# Patient Record
Sex: Female | Born: 1985 | Race: White | Hispanic: No | Marital: Married | Smoking: Former smoker
Health system: Southern US, Community
[De-identification: ages and names within clinical notes are randomized; demographics above are authoritative.]

## PROBLEM LIST (undated history)

## (undated) ENCOUNTER — Inpatient Hospital Stay (HOSPITAL_COMMUNITY): Payer: Self-pay

## (undated) DIAGNOSIS — N809 Endometriosis, unspecified: Secondary | ICD-10-CM

## (undated) DIAGNOSIS — E039 Hypothyroidism, unspecified: Secondary | ICD-10-CM

## (undated) DIAGNOSIS — O24419 Gestational diabetes mellitus in pregnancy, unspecified control: Secondary | ICD-10-CM

## (undated) DIAGNOSIS — B977 Papillomavirus as the cause of diseases classified elsewhere: Secondary | ICD-10-CM

## (undated) HISTORY — DX: Papillomavirus as the cause of diseases classified elsewhere: B97.7

## (undated) HISTORY — DX: Endometriosis, unspecified: N80.9

---
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
## 2004-12-25 DIAGNOSIS — B977 Papillomavirus as the cause of diseases classified elsewhere: Secondary | ICD-10-CM

## 2004-12-25 HISTORY — DX: Papillomavirus as the cause of diseases classified elsewhere: B97.7

## 2005-02-15 ENCOUNTER — Other Ambulatory Visit: Admission: RE | Admit: 2005-02-15 | Discharge: 2005-02-15 | Payer: Self-pay | Admitting: Gynecology

## 2005-08-28 ENCOUNTER — Other Ambulatory Visit: Admission: RE | Admit: 2005-08-28 | Discharge: 2005-08-28 | Payer: Self-pay | Admitting: Gynecology

## 2010-09-17 ENCOUNTER — Encounter: Payer: Self-pay | Admitting: Family Medicine

## 2011-03-16 ENCOUNTER — Other Ambulatory Visit (HOSPITAL_COMMUNITY)
Admission: RE | Admit: 2011-03-16 | Discharge: 2011-03-16 | Disposition: A | Discharge status: Home / Self Care | Payer: Managed Care, Other (non HMO) | Source: Ambulatory Visit | Attending: Gynecology | Admitting: Gynecology

## 2011-03-16 ENCOUNTER — Ambulatory Visit (INDEPENDENT_AMBULATORY_CARE_PROVIDER_SITE_OTHER): Payer: Managed Care, Other (non HMO) | Admitting: Women's Health

## 2011-03-16 ENCOUNTER — Other Ambulatory Visit: Payer: Self-pay | Admitting: Women's Health

## 2011-03-16 DIAGNOSIS — R823 Hemoglobinuria: Secondary | ICD-10-CM

## 2011-03-16 DIAGNOSIS — Z124 Encounter for screening for malignant neoplasm of cervix: Secondary | ICD-10-CM | POA: Insufficient documentation

## 2011-03-16 DIAGNOSIS — B373 Candidiasis of vulva and vagina: Secondary | ICD-10-CM

## 2011-03-16 DIAGNOSIS — Z833 Family history of diabetes mellitus: Secondary | ICD-10-CM

## 2011-03-16 DIAGNOSIS — Z01419 Encounter for gynecological examination (general) (routine) without abnormal findings: Secondary | ICD-10-CM

## 2011-03-20 ENCOUNTER — Telehealth: Payer: Self-pay | Admitting: Women's Health

## 2011-03-20 NOTE — Telephone Encounter (Signed)
Telephone call to patient and informed pad had no endocervical cells. Transferred call to appointments to schedule Pap. Patient has history of HPV. Last Pap had been normal.

## 2011-03-28 ENCOUNTER — Ambulatory Visit (INDEPENDENT_AMBULATORY_CARE_PROVIDER_SITE_OTHER): Payer: Managed Care, Other (non HMO) | Admitting: Women's Health

## 2011-03-28 ENCOUNTER — Other Ambulatory Visit (HOSPITAL_COMMUNITY)
Admission: RE | Admit: 2011-03-28 | Discharge: 2011-03-28 | Disposition: A | Discharge status: Home / Self Care | Payer: Managed Care, Other (non HMO) | Source: Ambulatory Visit | Attending: Women's Health | Admitting: Women's Health

## 2011-03-28 ENCOUNTER — Encounter: Payer: Self-pay | Admitting: Women's Health

## 2011-03-28 DIAGNOSIS — Z01419 Encounter for gynecological examination (general) (routine) without abnormal findings: Secondary | ICD-10-CM | POA: Insufficient documentation

## 2011-03-28 DIAGNOSIS — B3731 Acute candidiasis of vulva and vagina: Secondary | ICD-10-CM

## 2011-03-28 DIAGNOSIS — N898 Other specified noninflammatory disorders of vagina: Secondary | ICD-10-CM

## 2011-03-28 DIAGNOSIS — Z5189 Encounter for other specified aftercare: Secondary | ICD-10-CM

## 2011-03-28 DIAGNOSIS — R823 Hemoglobinuria: Secondary | ICD-10-CM

## 2011-03-28 DIAGNOSIS — R87616 Satisfactory cervical smear but lacking transformation zone: Secondary | ICD-10-CM

## 2011-03-28 DIAGNOSIS — B373 Candidiasis of vulva and vagina: Secondary | ICD-10-CM

## 2011-03-28 MED ORDER — NYSTATIN 100000 UNIT/GM EX CREA: TOPICAL_CREAM | Freq: Two times a day (BID) | CUTANEOUS | Status: DC

## 2011-03-28 NOTE — Progress Notes (Signed)
Addended byCammie Mcgee T on: 03/28/2011 02:27 PM   Modules accepted: Orders

## 2011-03-28 NOTE — Progress Notes (Signed)
Addended byCammie Mcgee T on: 03/28/2011 02:45 PM   Modules accepted: Orders

## 2011-03-28 NOTE — Progress Notes (Signed)
  Presents for a repeat Pap. Pap at annual exam on 7/20 was negative but lack endocervical cells. History of HPV in 2006, normal Paps after. At annual exam had a urine with 25,000 pure, symptomatic only of urgency, will check a TOC today. She was treated with 3 days of Septra . Was treated for yeast at her annual exam, states is much better after taking one Diflucan but states has occasional vaginal irritation. Nystatin cream to be used when necessary for external irritation. External genitalia is within normal limits, speculum exam cervix is pink healthy without lesion or discharge, Pap was taken and will triage based on those results.

## 2011-03-30 ENCOUNTER — Telehealth: Payer: Self-pay | Admitting: Women's Health

## 2011-04-02 NOTE — Telephone Encounter (Signed)
Left message on patient cell per her request that Pap was negative in UA was negative also.

## 2012-09-26 ENCOUNTER — Encounter: Payer: Self-pay | Admitting: Women's Health

## 2012-09-26 ENCOUNTER — Other Ambulatory Visit: Payer: Self-pay | Admitting: Women's Health

## 2012-09-26 ENCOUNTER — Ambulatory Visit (INDEPENDENT_AMBULATORY_CARE_PROVIDER_SITE_OTHER): Payer: BC Managed Care – PPO | Admitting: Women's Health

## 2012-09-26 VITALS — BP 116/64 | Ht 59.5 in | Wt 153.0 lb

## 2012-09-26 DIAGNOSIS — Z833 Family history of diabetes mellitus: Secondary | ICD-10-CM

## 2012-09-26 DIAGNOSIS — Z01419 Encounter for gynecological examination (general) (routine) without abnormal findings: Secondary | ICD-10-CM

## 2012-09-26 DIAGNOSIS — E079 Disorder of thyroid, unspecified: Secondary | ICD-10-CM

## 2012-09-26 LAB — CBC WITH DIFFERENTIAL/PLATELET
Basophils Absolute: 0 10*3/uL (ref 0.0–0.1)
Basophils Relative: 0 % (ref 0–1)
Eosinophils Absolute: 0.2 10*3/uL (ref 0.0–0.7)
Eosinophils Relative: 3 % (ref 0–5)
HCT: 37.7 % (ref 36.0–46.0)
Hemoglobin: 12.9 g/dL (ref 12.0–15.0)
Lymphocytes Relative: 42 % (ref 12–46)
Lymphs Abs: 2.3 10*3/uL (ref 0.7–4.0)
MCH: 29.9 pg (ref 26.0–34.0)
MCHC: 34.2 g/dL (ref 30.0–36.0)
MCV: 87.3 fL (ref 78.0–100.0)
Monocytes Absolute: 0.3 10*3/uL (ref 0.1–1.0)
Monocytes Relative: 6 % (ref 3–12)
Neutro Abs: 2.6 10*3/uL (ref 1.7–7.7)
Neutrophils Relative %: 49 % (ref 43–77)
Platelets: 324 10*3/uL (ref 150–400)
RBC: 4.32 MIL/uL (ref 3.87–5.11)
RDW: 13.2 % (ref 11.5–15.5)
WBC: 5.4 10*3/uL (ref 4.0–10.5)

## 2012-09-26 LAB — GLUCOSE, RANDOM: Glucose, Bld: 91 mg/dL (ref 70–99)

## 2012-09-26 LAB — TSH: TSH: 6.091 u[IU]/mL — ABNORMAL HIGH (ref 0.350–4.500)

## 2012-09-26 NOTE — Progress Notes (Signed)
Deanna Hicks 1986-08-22 [id number]    History:    The patient presents for annual exam.  Regular monthly cycle/condoms. No complaints. Gardasil series completed in 06. History of normal Paps.   Past medical history, past surgical history, family history and social history were all reviewed and documented in the EPIC chart. Father died type 1 diabetes complication. Mother and sister history of endometriosis. Recently graduated from nursing school, starting work next week at [employer] in [employer].   ROS:  A  ROS was performed and pertinent positives and negatives are included in the history.  Exam:  Filed Vitals:   09/26/12 0952  BP: 116/64    General appearance:  Normal Head/Neck:  Normal, without cervical or supraclavicular adenopathy. Thyroid:  Symmetrical, normal in size, without palpable masses or nodularity. Respiratory  Effort:  Normal  Auscultation:  Clear without wheezing or rhonchi Cardiovascular  Auscultation:  Regular rate, without rubs, murmurs or gallops  Edema/varicosities:  Not grossly evident Abdominal  Soft,nontender, without masses, guarding or rebound.  Liver/spleen:  No organomegaly noted  Hernia:  None appreciated  Skin  Inspection:  Grossly normal  Palpation:  Grossly normal Neurologic/psychiatric  Orientation:  Normal with appropriate conversation.  Mood/affect:  Normal  Genitourinary    Breasts: Examined lying and sitting.     Right: Without masses, retractions, discharge or axillary adenopathy.     Left: Without masses, retractions, discharge or axillary adenopathy.   Inguinal/mons:  Normal without inguinal adenopathy  External genitalia:  Normal  BUS/Urethra/Skene's glands:  Normal  Bladder:  Normal  Vagina:  Normal  Cervix:  Normal  Uterus:   normal in size, shape and contour.  Midline and mobile  Adnexa/parametria:     Rt: Without masses or tenderness.   Lt: Without masses or tenderness.  Anus and  perineum: Normal  Digital rectal exam: Normal sphincter tone without palpated masses or tenderness  Assessment/Plan:  27 y.o. M. WF G0 for annual exam with no complaints.  Normal GYN exam/condoms 18 pound weight gain past year and a half  Plan: Reviewed importance of decreasing calories, increasing exercise for weight loss. SBE's, calcium rich diet, MVI daily encouraged. Contraception options reviewed and declined, will continue condoms. Aware Plan B emergency contraception. CBC, glucose, TSH, UA, Pap normal 2012, new screening guidelines reviewed.    Harrington Challenger WHNP, 11:15 AM 09/26/2012

## 2012-09-26 NOTE — Patient Instructions (Addendum)

## 2012-09-27 LAB — URINALYSIS W MICROSCOPIC + REFLEX CULTURE
Casts: NONE SEEN
Crystals: NONE SEEN
Glucose, UA: NEGATIVE mg/dL
Hgb urine dipstick: NEGATIVE
Ketones, ur: NEGATIVE mg/dL
Leukocytes, UA: NEGATIVE
Nitrite: NEGATIVE
Protein, ur: NEGATIVE mg/dL
Specific Gravity, Urine: >1.03 — ABNORMAL HIGH (ref 1.005–1.030)
Urobilinogen, UA: 0.2 mg/dL (ref 0.0–1.0)
pH: 5.5 (ref 5.0–8.0)

## 2012-09-29 LAB — THYROID PROFILE - CHCC
Free Thyroxine Index: 3.1 (ref 1.0–3.9)
T3 Uptake: 32.8 % (ref 22.5–37.0)
T4, Total: 9.5 ug/dL (ref 5.0–12.5)

## 2012-10-02 ENCOUNTER — Other Ambulatory Visit: Payer: Self-pay | Admitting: Women's Health

## 2012-10-02 DIAGNOSIS — E039 Hypothyroidism, unspecified: Secondary | ICD-10-CM

## 2012-10-02 MED ORDER — LEVOTHYROXINE SODIUM 50 MCG PO TABS: 50.0000 ug | ORAL_TABLET | Freq: Every day | ORAL | Status: DC

## 2012-10-11 ENCOUNTER — Other Ambulatory Visit: Payer: Self-pay

## 2012-10-28 ENCOUNTER — Telehealth: Payer: Self-pay | Admitting: *Deleted

## 2012-10-28 NOTE — Telephone Encounter (Signed)
Ok, pt was diagnosed with hypothyroidism in Jan,2014, TSH 6.09 placed on Synthroid 50, had a normal rest of thyroid panel.  Please send labs and OV note  thanks

## 2012-10-28 NOTE — Telephone Encounter (Signed)
Pt called requesting referral to Dr.Balan, pt has family history of hyperthyroidism. She takes Synthroid 50 mcg daily. Please advise

## 2012-10-29 NOTE — Telephone Encounter (Signed)
appt on 11/25/12 @ 9:30 a. Left time and date on pt voicemail.

## 2012-10-29 NOTE — Telephone Encounter (Signed)
Pt informed notes and labs faxed to [facility] office.

## 2012-11-28 ENCOUNTER — Other Ambulatory Visit: Payer: Self-pay | Admitting: Women's Health

## 2013-01-05 ENCOUNTER — Other Ambulatory Visit: Payer: Self-pay | Admitting: Women's Health

## 2013-01-06 NOTE — Telephone Encounter (Signed)
LM for pt she is due for a TSH before refill KW

## 2013-01-08 NOTE — Telephone Encounter (Signed)
Rx sent and left detailed message that pt needs TSH KW

## 2013-07-02 ENCOUNTER — Other Ambulatory Visit: Payer: Self-pay

## 2013-10-06 ENCOUNTER — Encounter: Payer: Self-pay | Admitting: Women's Health

## 2013-10-09 ENCOUNTER — Encounter: Payer: Self-pay | Admitting: Women's Health

## 2013-10-09 ENCOUNTER — Ambulatory Visit (INDEPENDENT_AMBULATORY_CARE_PROVIDER_SITE_OTHER): Payer: BC Managed Care – PPO | Admitting: Women's Health

## 2013-10-09 ENCOUNTER — Other Ambulatory Visit (HOSPITAL_COMMUNITY)
Admission: RE | Admit: 2013-10-09 | Discharge: 2013-10-09 | Disposition: A | Discharge status: Home / Self Care | Payer: BC Managed Care – PPO | Source: Ambulatory Visit | Attending: Gynecology | Admitting: Gynecology

## 2013-10-09 VITALS — BP 122/76 | Ht 59.0 in | Wt 131.0 lb

## 2013-10-09 DIAGNOSIS — E039 Hypothyroidism, unspecified: Secondary | ICD-10-CM | POA: Insufficient documentation

## 2013-10-09 DIAGNOSIS — Z01419 Encounter for gynecological examination (general) (routine) without abnormal findings: Secondary | ICD-10-CM | POA: Insufficient documentation

## 2013-10-09 DIAGNOSIS — E079 Disorder of thyroid, unspecified: Secondary | ICD-10-CM

## 2013-10-09 LAB — CBC WITH DIFFERENTIAL/PLATELET
Basophils Absolute: 0 10*3/uL (ref 0.0–0.1)
Basophils Relative: 0 % (ref 0–1)
Eosinophils Absolute: 0.1 10*3/uL (ref 0.0–0.7)
Eosinophils Relative: 1 % (ref 0–5)
HCT: 38.3 % (ref 36.0–46.0)
Hemoglobin: 13.5 g/dL (ref 12.0–15.0)
Lymphocytes Relative: 27 % (ref 12–46)
Lymphs Abs: 2.1 10*3/uL (ref 0.7–4.0)
MCH: 31.3 pg (ref 26.0–34.0)
MCHC: 35.2 g/dL (ref 30.0–36.0)
MCV: 88.7 fL (ref 78.0–100.0)
Monocytes Absolute: 0.5 10*3/uL (ref 0.1–1.0)
Monocytes Relative: 6 % (ref 3–12)
Neutro Abs: 5.2 10*3/uL (ref 1.7–7.7)
Neutrophils Relative %: 66 % (ref 43–77)
Platelets: 343 10*3/uL (ref 150–400)
RBC: 4.32 MIL/uL (ref 3.87–5.11)
RDW: 13.1 % (ref 11.5–15.5)
WBC: 7.9 10*3/uL (ref 4.0–10.5)

## 2013-10-09 NOTE — Patient Instructions (Signed)
Pregnancy If you are planning on getting pregnant, it is a good idea to make a preconception appointment with your caregiver to discuss having a healthy lifestyle before getting pregnant. This includes diet, weight, exercise, taking prenatal vitamins (especially folic acid, which helps prevent brain and spinal cord defects), avoiding alcohol, smoking and illegal drugs, medical problems (diabetes, convulsions), family history of genetic problems, working conditions, and immunizations. It is better to have knowledge of these things and do something about them before getting pregnant. During your pregnancy, it is important to follow certain guidelines in order to have a healthy baby. It is very important to get good prenatal care and follow your caregiver's instructions. Prenatal care includes all the medical care you receive before your baby's birth. This helps to prevent problems during the pregnancy and childbirth. HOME CARE INSTRUCTIONS   Start your prenatal visits by the 12th week of pregnancy or earlier, if possible. At first, appointments are usually scheduled monthly. They become more frequent in the last 2 months before delivery. It is important that you keep your caregiver's appointments and follow your caregiver's instructions regarding medication use, exercise, and diet.  During pregnancy, you are providing food for you and your baby. Eat a regular, well-balanced diet. Choose foods such as meat, fish, milk and other dairy products, vegetables, fruits, whole-grain breads and cereals. Your caregiver will inform you of the ideal weight gain depending on your current height and weight. Drink lots of liquids. Try to drink 8 glasses of water a day.  Alcohol is associated with a number of birth defects including fetal alcohol syndrome. It is best to avoid alcohol completely. Smoking will cause low birth rate and prematurity. Use of alcohol and nicotine during your pregnancy also increases the chances that  your child will be chemically dependent later in their life and may contribute to SIDS (Sudden Infant Death Syndrome).  Do not use illegal drugs.  Only take prescription or over-the-counter medications that are recommended by your caregiver. Other medications can cause genetic and physical problems in the baby.  Morning sickness can often be helped by keeping soda crackers at the bedside. Eat a few before getting up in the morning.  A sexual relationship may be continued until near the end of pregnancy if there are no other problems such as early (premature) leaking of amniotic fluid from the membranes, vaginal bleeding, painful intercourse or belly (abdominal) pain.  Exercise regularly. Check with your caregiver if you are unsure of the safety of some of your exercises.  Do not use hot tubs, steam rooms or saunas. These increase the risk of fainting and hurting yourself and the baby. Swimming is OK for exercise. Get plenty of rest, including afternoon naps when possible, especially in the third trimester.  Avoid toxic odors and chemicals.  Do not wear high heels. They may cause you to lose your balance and fall.  Do not lift over 5 pounds. If you do lift anything, lift with your legs and thighs, not your back.  Avoid long trips, especially in the third trimester.  If you have to travel out of the city or state, take a copy of your medical records with you. SEEK IMMEDIATE MEDICAL CARE IF:   You develop an unexplained oral temperature above 102 F (38.9 C), or as your caregiver suggests.  You have leaking of fluid from the vagina. If leaking membranes are suspected, take your temperature and inform your caregiver of this when you call.  There is vaginal spotting   or bleeding. Notify your caregiver of the amount and how many pads are used.  You continue to feel sick to your stomach (nauseous) and have no relief from remedies suggested, or you throw up (vomit) blood or coffee ground like  materials.  You develop upper abdominal pain.  You have round ligament discomfort in the lower abdominal area. This still must be evaluated by your caregiver.  You feel contractions of the uterus.  You do not feel the baby move, or there is less movement than before.  You have painful urination.  You have abnormal vaginal discharge.  You have persistent diarrhea.  You get a severe headache.  You have problems with your vision.  You develop muscle weakness.  You feel dizzy and faint.  You develop shortness of breath.  You develop chest pain.  You have back pain that travels down to your leg and feet.  You feel irregular or a very fast heartbeat.  You develop excessive weight gain in a short period of time (5 pounds in 3 to 5 days).  You are involved in a domestic violence situation. Document Released: 08/13/2005 Document Revised: 02/12/2012 Document Reviewed: 02/04/2009 [facility] Patient Information 2014 [place]. Health Maintenance, Female A healthy lifestyle and preventative care can promote health and wellness.  Maintain regular health, dental, and eye exams.  Eat a healthy diet. Foods like vegetables, fruits, whole grains, low-fat dairy products, and lean protein foods contain the nutrients you need without too many calories. Decrease your intake of foods high in solid fats, added sugars, and salt. Get information about a proper diet from your caregiver, if necessary.  Regular physical exercise is one of the most important things you can do for your health. Most adults should get at least 150 minutes of moderate-intensity exercise (any activity that increases your heart rate and causes you to sweat) each week. In addition, most adults need muscle-strengthening exercises on 2 or more days a week.   Maintain a healthy weight. The body mass index (BMI) is a screening tool to identify possible weight problems. It provides an estimate of body fat based on height and  weight. Your caregiver can help determine your BMI, and can help you achieve or maintain a healthy weight. For adults 20 years and older:  A BMI below 18.5 is considered underweight.  A BMI of 18.5 to 24.9 is normal.  A BMI of 25 to 29.9 is considered overweight.  A BMI of 30 and above is considered obese.  Maintain normal blood lipids and cholesterol by exercising and minimizing your intake of saturated fat. Eat a balanced diet with plenty of fruits and vegetables. Blood tests for lipids and cholesterol should begin at age 76 and be repeated every 5 years. If your lipid or cholesterol levels are high, you are over 50, or you are a high risk for heart disease, you may need your cholesterol levels checked more frequently.Ongoing high lipid and cholesterol levels should be treated with medicines if diet and exercise are not effective.  If you smoke, find out from your caregiver how to quit. If you do not use tobacco, do not start.  Lung cancer screening is recommended for adults aged 85 80 years who are at high risk for developing lung cancer because of a history of smoking. Yearly low-dose computed tomography (CT) is recommended for people who have at least a 30-pack-year history of smoking and are a current smoker or have quit within the past 15 years. A pack year  of smoking is smoking an average of 1 pack of cigarettes a day for 1 year (for example: 1 pack a day for 30 years or 2 packs a day for 15 years). Yearly screening should continue until the smoker has stopped smoking for at least 15 years. Yearly screening should also be stopped for people who develop a health problem that would prevent them from having lung cancer treatment.  If you are pregnant, do not drink alcohol. If you are breastfeeding, be very cautious about drinking alcohol. If you are not pregnant and choose to drink alcohol, do not exceed 1 drink per day. One drink is considered to be 12 ounces (355 mL) of beer, 5 ounces (148  mL) of wine, or 1.5 ounces (44 mL) of liquor.  Avoid use of street drugs. Do not share needles with anyone. Ask for help if you need support or instructions about stopping the use of drugs.  High blood pressure causes heart disease and increases the risk of stroke. Blood pressure should be checked at least every 1 to 2 years. Ongoing high blood pressure should be treated with medicines, if weight loss and exercise are not effective.  If you are 91 to 30 years old, ask your caregiver if you should take aspirin to prevent strokes.  Diabetes screening involves taking a blood sample to check your fasting blood sugar level. This should be done once every 3 years, after age 19, if you are within normal weight and without risk factors for diabetes. Testing should be considered at a younger age or be carried out more frequently if you are overweight and have at least 1 risk factor for diabetes.  Breast cancer screening is essential preventative care for women. You should practice "breast self-awareness." This means understanding the normal appearance and feel of your breasts and may include breast self-examination. Any changes detected, no matter how small, should be reported to a caregiver. Women in their 47s and 30s should have a clinical breast exam (CBE) by a caregiver as part of a regular health exam every 1 to 3 years. After age 68, women should have a CBE every year. Starting at age 79, women should consider having a mammogram (breast X-ray) every year. Women who have a family history of breast cancer should talk to their caregiver about genetic screening. Women at a high risk of breast cancer should talk to their caregiver about having an MRI and a mammogram every year.  Breast cancer gene (BRCA)-related cancer risk assessment is recommended for women who have family members with BRCA-related cancers. BRCA-related cancers include breast, ovarian, tubal, and peritoneal cancers. Having family members with  these cancers may be associated with an increased risk for harmful changes (mutations) in the breast cancer genes BRCA1 and BRCA2. Results of the assessment will determine the need for genetic counseling and BRCA1 and BRCA2 testing.  The Pap test is a screening test for cervical cancer. Women should have a Pap test starting at age 63. Between ages 88 and 78, Pap tests should be repeated every 2 years. Beginning at age 10, you should have a Pap test every 3 years as long as the past 3 Pap tests have been normal. If you had a hysterectomy for a problem that was not cancer or a condition that could lead to cancer, then you no longer need Pap tests. If you are between ages 72 and 8, and you have had normal Pap tests going back 10 years, you no longer need Pap  tests. If you have had past treatment for cervical cancer or a condition that could lead to cancer, you need Pap tests and screening for cancer for at least 20 years after your treatment. If Pap tests have been discontinued, risk factors (such as a new sexual partner) need to be reassessed to determine if screening should be resumed. Some women have medical problems that increase the chance of getting cervical cancer. In these cases, your caregiver may recommend more frequent screening and Pap tests.  The human papillomavirus (HPV) test is an additional test that may be used for cervical cancer screening. The HPV test looks for the virus that can cause the cell changes on the cervix. The cells collected during the Pap test can be tested for HPV. The HPV test could be used to screen women aged 86 years and older, and should be used in women of any age who have unclear Pap test results. After the age of 67, women should have HPV testing at the same frequency as a Pap test.  Colorectal cancer can be detected and often prevented. Most routine colorectal cancer screening begins at the age of 42 and continues through age 89. However, your caregiver may recommend  screening at an earlier age if you have risk factors for colon cancer. On a yearly basis, your caregiver may provide home test kits to check for hidden blood in the stool. Use of a small camera at the end of a tube, to directly examine the colon (sigmoidoscopy or colonoscopy), can detect the earliest forms of colorectal cancer. Talk to your caregiver about this at age 46, when routine screening begins. Direct examination of the colon should be repeated every 5 to 10 years through age 72, unless early forms of pre-cancerous polyps or small growths are found.  Hepatitis C blood testing is recommended for all people born from [birth year] through 1967 and any individual with known risks for hepatitis C.  Practice safe sex. Use condoms and avoid high-risk sexual practices to reduce the spread of sexually transmitted infections (STIs). Sexually active women aged 30 and younger should be checked for Chlamydia, which is a common sexually transmitted infection. Older women with new or multiple partners should also be tested for Chlamydia. Testing for other STIs is recommended if you are sexually active and at increased risk.  Osteoporosis is a disease in which the bones lose minerals and strength with aging. This can result in serious bone fractures. The risk of osteoporosis can be identified using a bone density scan. Women ages 90 and over and women at risk for fractures or osteoporosis should discuss screening with their caregivers. Ask your caregiver whether you should be taking a calcium supplement or vitamin D to reduce the rate of osteoporosis.  Menopause can be associated with physical symptoms and risks. Hormone replacement therapy is available to decrease symptoms and risks. You should talk to your caregiver about whether hormone replacement therapy is right for you.  Use sunscreen. Apply sunscreen liberally and repeatedly throughout the day. You should seek shade when your shadow is shorter than you. Protect  yourself by wearing long sleeves, pants, a wide-brimmed hat, and sunglasses year round, whenever you are outdoors.  Notify your caregiver of new moles or changes in moles, especially if there is a change in shape or color. Also notify your caregiver if a mole is larger than the size of a pencil eraser.  Stay current with your immunizations. Document Released: 02/26/2011 Document Revised: 12/08/2012 Document Reviewed: 02/26/2011  09/24/2011 °Exit[employer]® Patient Information ©2014 [employer]. ° °

## 2013-10-09 NOTE — Addendum Note (Signed)
Addended by: Bertram SavinGONZALEZ-Hopie Pellegrin A on: 10/09/2013 03:50 PM   Modules accepted: Orders

## 2013-10-09 NOTE — Progress Notes (Signed)
Deanna Hicks 02-15-1986 [id number]    History:    Presents for annual exam.  Monthly cycle condoms until recently, planning to  conceive. Gardasil series completed. Normal Paps. Hypothyroid without followup. Has lost 20 pounds in the past year with diet.  Past medical history, past surgical history, family history and social history were all reviewed and documented in the EPIC chart[place] [facility] [employer]. Father type 1 diabetes, heart disease died at age 30. Sister  Hashimoto thyroiditis.  ROS:  A  ROS was performed and pertinent positives and negatives are included.  Exam:  Filed Vitals:   10/09/13 1410  BP: 122/76    General appearance:  Normal Thyroid:  Symmetrical, normal in size, without palpable masses or nodularity. Respiratory  Auscultation:  Clear without wheezing or rhonchi Cardiovascular  Auscultation:  Regular rate, without rubs, murmurs or gallops  Edema/varicosities:  Not grossly evident Abdominal  Soft,nontender, without masses, guarding or rebound.  Liver/spleen:  No organomegaly noted  Hernia:  None appreciated  Skin  Inspection:  Grossly normal   Breasts: Examined lying and sitting.     Right: Without masses, retractions, discharge or axillary adenopathy.     Left: Without masses, retractions, discharge or axillary adenopathy. Gentitourinary   Inguinal/mons:  Normal without inguinal adenopathy  External genitalia:  Normal  BUS/Urethra/Skene's glands:  Normal  Vagina:  Normal  Cervix:  Normal  Uterus:  normal in size, shape and contour.  Midline and mobile  Adnexa/parametria:     Rt: Without masses or tenderness.   Lt: Without masses or tenderness.  Anus and perineum: Normal  Digital rectal exam: Normal sphincter tone without palpated masses or tenderness  Assessment/Plan:  28 y.o. MWF G0 for annual exam.     Hypothyroid without followup Normal GYN exam planning conception  Plan: CBC, TSH . thyroid panel, rubella titer, UA, Pap. Pap  normal 2012, new screening guidelines reviewed. SBE's, regular exercise, calcium rich diet, prenatal vitamin daily,  healthy pregnancy behaviors reviewed. Aware we no longer deliver, viability ultrasound with missed cycle.    Harrington ChallengerYOUNG,Deanna Hicks, 3:41 PM 10/09/2013

## 2013-10-10 LAB — URINALYSIS W MICROSCOPIC + REFLEX CULTURE
Bacteria, UA: NONE SEEN
Bilirubin Urine: NEGATIVE
Casts: NONE SEEN
Crystals: NONE SEEN
Glucose, UA: NEGATIVE mg/dL
Hgb urine dipstick: NEGATIVE
Ketones, ur: NEGATIVE mg/dL
Leukocytes, UA: NEGATIVE
Nitrite: NEGATIVE
Protein, ur: NEGATIVE mg/dL
Specific Gravity, Urine: <1.005 — ABNORMAL LOW (ref 1.005–1.030)
Squamous Epithelial / LPF: NONE SEEN
Urobilinogen, UA: 0.2 mg/dL (ref 0.0–1.0)
pH: 7 (ref 5.0–8.0)

## 2013-10-10 LAB — RUBELLA SCREEN: Rubella: 4.05 Index — ABNORMAL HIGH (ref <0.90)

## 2013-10-10 LAB — T3 UPTAKE: T3 Uptake: 33.2 % (ref 22.5–37.0)

## 2013-10-10 LAB — TSH: TSH: 4.6 u[IU]/mL — ABNORMAL HIGH (ref 0.350–4.500)

## 2013-10-10 LAB — T4: T4, Total: 9.6 ug/dL (ref 5.0–12.5)

## 2013-10-12 LAB — THYROID ANTIBODIES
Thyroglobulin Ab: <20 U/mL (ref <40.0)
Thyroperoxidase Ab SerPl-aCnc: 147 IU/mL — ABNORMAL HIGH (ref <35.0)

## 2013-10-13 ENCOUNTER — Encounter: Payer: Self-pay | Admitting: Women's Health

## 2013-10-14 ENCOUNTER — Encounter: Payer: Self-pay | Admitting: Women's Health

## 2013-10-15 ENCOUNTER — Other Ambulatory Visit: Payer: Self-pay | Admitting: Women's Health

## 2013-10-15 ENCOUNTER — Telehealth: Payer: Self-pay | Admitting: *Deleted

## 2013-10-15 ENCOUNTER — Telehealth: Payer: Self-pay

## 2013-10-15 MED ORDER — LEVOTHYROXINE SODIUM 50 MCG PO TABS: ORAL_TABLET | ORAL | Status: DC

## 2013-10-15 MED ORDER — LEVOTHYROXINE SODIUM 75 MCG PO TABS: ORAL_TABLET | ORAL | Status: DC

## 2013-10-15 NOTE — Telephone Encounter (Signed)
[facility] will contact me with time and date.

## 2013-10-15 NOTE — Telephone Encounter (Signed)
Message copied by Aura CampsWEBB, JENNIFER L on Thu Oct 15, 2013 10:40 AM ------      Message from: Keenan BachelorANNAS, KATHERINE R      Created: Thu Oct 15, 2013  9:07 AM      Regarding: Needs referral to [facility]       Email said "Wenda LowHi Nancy,           Dr. Willeen CassBalan's office needs another referral before they will allow me to make an appointment ."            Evidently she has seen Dr. Talmage NapBalan in the past and Harriett Sineancy told her to go back and see her due to TSH result. Harriett Sineancy wrote her:      " TSH only slightly elevated, and thyroid panel normal but positive for antibodies,  Take lab results to Dr Talmage NapBalan.  Stay well   Harriett SineNancy  "                            ------

## 2013-10-15 NOTE — Telephone Encounter (Signed)
Patient emailed you back. "Hi Harriett SineNancy,  Dr. Willeen CassBalan's office needs another referral before they will allow me to make an appointment. Also, is it possible to have my prescription for levothyroxine refilled while I am waiting to see Dr. Talmage NapBalan."  Victorino DikeJennifer will take care of the referral to Dr. Talmage NapBalan. Ok to refill her levothyroxine?

## 2013-10-15 NOTE — Telephone Encounter (Signed)
Thanks for getting referral done, TSH slightly high, has been on 50, increase to 75 daily Monday thru_Friday and 50 on the weekends.  She is a Engineer, civil (consulting)nurse.  thanks

## 2013-10-15 NOTE — Telephone Encounter (Signed)
Patient informed via email. Rxs sent to her pharmacy with directions for days of week to take.

## 2013-10-28 NOTE — Telephone Encounter (Signed)
[facility] called back from Dr.Balan and patient scheduled on 11/17/13 @ 9:30 am

## 2014-03-04 ENCOUNTER — Other Ambulatory Visit: Payer: Self-pay

## 2014-04-27 ENCOUNTER — Encounter: Payer: Self-pay | Admitting: Women's Health

## 2014-04-27 ENCOUNTER — Ambulatory Visit (INDEPENDENT_AMBULATORY_CARE_PROVIDER_SITE_OTHER): Payer: BC Managed Care – PPO | Admitting: Women's Health

## 2014-04-27 DIAGNOSIS — N912 Amenorrhea, unspecified: Secondary | ICD-10-CM

## 2014-04-27 LAB — PREGNANCY, URINE: Preg Test, Ur: POSITIVE

## 2014-04-27 NOTE — Progress Notes (Signed)
Patient ID: DELCIE RUPPERT, female   DOB: November 07, 1985, 28 y.o.   MRN: [id number] Presents with positive urine pregnancy test. LMP 03/21/2014 normal cycle. Taking prenatal vitamin daily, denies abdominal pain, bleeding or discharge. Aware of safe pregnancy behaviors. Rubella immune, hypothyroid. Has followup with Dr. Talmage Nap today.  Exam: Appears well. U PT positive  Early pregnancy approximately 5 weeks Hypothyroid  Plan: Continue prenatal vitamin daily, safe pregnancy behaviors and keep scheduled followup for hypothyroidism. Schedule  dating/viability ultrasound after September 15. Aware we no longer deliver, will schedule new OB care after ultrasound.

## 2014-04-27 NOTE — Patient Instructions (Signed)
Exercise During Pregnancy It is possible to maintain a healthy exercise program throughout a pregnancy. However, it is important to discuss exercising with your caregiver, so that the two of you may develop an appropriate exercise program. It is important to remember that exercise during pregnancy should be use to maintain one's health and not to lose weight. Strenuous activities should be avoided as the may cause the baby to have difficulty obtaining proper amounts of oxygen. A proper pregnancy exercise plan has many benefits including preparing you for the physical challenges of childbirth by strengthening the muscles that help with childbirth, reducing common backaches, alleviating constipation, improving posture, elevating mood, and promoting better sleep. If possible, begin exercising regularly before you become pregnant and continue through the duration of the pregnancy. It is difficult for a woman to begin an exercise program later in a pregnancy due to enlargement of the uterus and breasts and a shift in the center of gravity. Pregnancy exercise programs should be aimed at improving the muscles of the heart, back, pelvis, and abdomen. GENERAL GUIDELINES  Every woman and pregnancy is different; thus, the level of exercise you can do depends on your health, the conditions of the pregnancy, and activity level before pregnancy. For women who were sedentary before pregnancy, walking is a good way to begin. Use caution while participation in sports during pregnancy, because your center of gravity changes and may affect your balance or that require rapid movements. Always make sure to drink plenty of fluids to avoid dehydration, which may decrease blood flow to your baby. Avoid any activity that has the potential for trauma to the abdomen. If possible try to avoid high-altitude activities, which can deprive you and your baby of oxygen; this may cause premature labor. Talk with your caregiver.  Performing a  proper warm up and cool down are very important. It is important to start slowly and build up to more demanding exercises. Toward the end of an exercise session, gradually slow your activity. Perhaps try and work back through the exercises in reverse order. Check your pulse during peak activity, and discuss with your caregiver an appropriate range of heart rate for activity. Slow down your activity if your heart starts beating faster than the target range recommended by your health care provider. Do not exceed a heart rate of 140 beats per minute. Exercise that is too strenuous may speed up the baby's heartbeat to a dangerous level. In general, if you are able to carry on a conversation comfortably while exercising, your heart rate is probably within the recommended limits. Check to make sure.  You should stop exercising and call your health care provider if you have any unusual symptoms, such as pain, uterine contractions, chest pain, bleeding or fluid leakage from the vagina, dizziness, or shortness of breath. Talk to your health caregiver if you have any questions.  Document Released: 08/13/2005 Document Revised: 11/05/2011 Document Reviewed: 11/25/2008 ExitCare Patient Information 2015 [employer]. This information is not intended to replace advice given to you by your health care provider. Make sure you discuss any questions you have with your health care provider.  

## 2014-04-28 ENCOUNTER — Telehealth: Payer: Self-pay | Admitting: Gynecology

## 2014-04-28 ENCOUNTER — Encounter: Payer: Self-pay | Admitting: Women's Health

## 2014-04-28 ENCOUNTER — Telehealth: Payer: Self-pay | Admitting: *Deleted

## 2014-04-28 ENCOUNTER — Ambulatory Visit (INDEPENDENT_AMBULATORY_CARE_PROVIDER_SITE_OTHER): Payer: BC Managed Care – PPO | Admitting: Women's Health

## 2014-04-28 ENCOUNTER — Ambulatory Visit (HOSPITAL_COMMUNITY)
Admission: AD | Admit: 2014-04-28 | Discharge: 2014-04-28 | Disposition: A | Discharge status: Home / Self Care | Payer: BC Managed Care – PPO | Source: Ambulatory Visit | Attending: Women's Health | Admitting: Women's Health

## 2014-04-28 DIAGNOSIS — O039 Complete or unspecified spontaneous abortion without complication: Secondary | ICD-10-CM

## 2014-04-28 LAB — ABO/RH: ABO/RH(D): O POS

## 2014-04-28 LAB — ABO AND RH: Rh Type: POSITIVE

## 2014-04-28 LAB — HCG, QUANTITATIVE, PREGNANCY: hCG, Beta Chain, Quant, S: 26.24 m[IU]/mL

## 2014-04-28 NOTE — Telephone Encounter (Signed)
O+, HCG 26.24

## 2014-04-28 NOTE — Telephone Encounter (Signed)
Telephone call, states small amount of brown mucousy type spotting, denies any bright red, minimal cramping with minimal low backache. Will watch at this time if it continues or changes office visit to check quant

## 2014-04-28 NOTE — Progress Notes (Signed)
Patient ID: Deanna Hicks, female   DOB: 03-21-1986, 28 y.o.   MRN: [id number] Presents with heavy bleeding with clots for several hours. Started with scant brown spotting this a.m. became heavier passing clots with cramping. Confirmed pregnancy with U PT 04/27/2014. LMP 03/21/2014, 5-1/[redacted] weeks gestation. Hypothyroid followed by Dr. Talmage Nap.  Exam: Tearful. Abdomen nontender, External genitalia within normal limits, speculum exam copious red blood with clots noted. Bimanual no CMT or adnexal fullness or tenderness.  Probable SAB Hypothyroid  Plan: Quantitative hCG,  ABO Rh pending. Continue prenatal vitamins and thyroid medication.  Instructed to call if bleeding gets heavier than a pad q. hour or does not stop within one week. Repeat quantitative HCG in one week. Condolences given.

## 2014-04-28 NOTE — Patient Instructions (Signed)
Miscarriage A miscarriage is the sudden loss of an unborn baby (fetus) before the 20th week of pregnancy. Most miscarriages happen in the first 3 months of pregnancy. Sometimes, it happens before a woman even knows she is pregnant. A miscarriage is also called a "spontaneous miscarriage" or "early pregnancy loss." Having a miscarriage can be an emotional experience. Talk with your caregiver about any questions you may have about miscarrying, the grieving process, and your future pregnancy plans. CAUSES   Problems with the fetal chromosomes that make it impossible for the baby to develop normally. Problems with the baby's genes or chromosomes are most often the result of errors that occur, by chance, as the embryo divides and grows. The problems are not inherited from the parents.  Infection of the cervix or uterus.   Hormone problems.   Problems with the cervix, such as having an incompetent cervix. This is when the tissue in the cervix is not strong enough to hold the pregnancy.   Problems with the uterus, such as an abnormally shaped uterus, uterine fibroids, or congenital abnormalities.   Certain medical conditions.   Smoking, drinking alcohol, or taking illegal drugs.   Trauma.  Often, the cause of a miscarriage is unknown.  SYMPTOMS   Vaginal bleeding or spotting, with or without cramps or pain.  Pain or cramping in the abdomen or lower back.  Passing fluid, tissue, or blood clots from the vagina. DIAGNOSIS  Your caregiver will perform a physical exam. You may also have an ultrasound to confirm the miscarriage. Blood or urine tests may also be ordered. TREATMENT   Sometimes, treatment is not necessary if you naturally pass all the fetal tissue that was in the uterus. If some of the fetus or placenta remains in the body (incomplete miscarriage), tissue left behind may become infected and must be removed. Usually, a dilation and curettage (D and C) procedure is performed.  During a D and C procedure, the cervix is widened (dilated) and any remaining fetal or placental tissue is gently removed from the uterus.  Antibiotic medicines are prescribed if there is an infection. Other medicines may be given to reduce the size of the uterus (contract) if there is a lot of bleeding.  If you have Rh negative blood and your baby was Rh positive, you will need a Rh immunoglobulin shot. This shot will protect any future baby from having Rh blood problems in future pregnancies. HOME CARE INSTRUCTIONS   Your caregiver may order bed rest or may allow you to continue light activity. Resume activity as directed by your caregiver.  Have someone help with home and family responsibilities during this time.   Keep track of the number of sanitary pads you use each day and how soaked (saturated) they are. Write down this information.   Do not use tampons. Do not douche or have sexual intercourse until approved by your caregiver.   Only take over-the-counter or prescription medicines for pain or discomfort as directed by your caregiver.   Do not take aspirin. Aspirin can cause bleeding.   Keep all follow-up appointments with your caregiver.   If you or your partner have problems with grieving, talk to your caregiver or seek counseling to help cope with the pregnancy loss. Allow enough time to grieve before trying to get pregnant again.  SEEK IMMEDIATE MEDICAL CARE IF:   You have severe cramps or pain in your back or abdomen.  You have a fever.  You pass large blood clots (walnut-sized   or larger) ortissue from your vagina. Save any tissue for your caregiver to inspect.   Your bleeding increases.   You have a thick, bad-smelling vaginal discharge.  You become lightheaded, weak, or you faint.   You have chills.  MAKE SURE YOU:  Understand these instructions.  Will watch your condition.  Will get help right away if you are not doing well or get  worse. Document Released: 02/06/2001 Document Revised: 12/08/2012 Document Reviewed: 10/02/2011 ExitCare Patient Information 2015 [employer]. This information is not intended to replace advice given to you by your health care provider. Make sure you discuss any questions you have with your health care provider.  

## 2014-04-28 NOTE — Telephone Encounter (Signed)
Pt was seen yesterday [redacted] weeks pregnant this am noticed some brown spotting twice this am. Pt also c/o lower back pain which started yesterday evening, mild cramping as well. Pt asked if all normal? Or return to office? Pt can be reached on cell [phone] if needed.

## 2014-04-30 ENCOUNTER — Other Ambulatory Visit: Payer: BC Managed Care – PPO

## 2014-05-05 ENCOUNTER — Other Ambulatory Visit: Payer: BC Managed Care – PPO

## 2014-05-05 DIAGNOSIS — O039 Complete or unspecified spontaneous abortion without complication: Secondary | ICD-10-CM

## 2014-05-05 LAB — HCG, QUANTITATIVE, PREGNANCY: hCG, Beta Chain, Quant, S: 2.79 m[IU]/mL

## 2014-05-12 ENCOUNTER — Other Ambulatory Visit: Payer: BC Managed Care – PPO

## 2014-05-12 ENCOUNTER — Ambulatory Visit: Payer: BC Managed Care – PPO | Admitting: Women's Health

## 2014-06-01 ENCOUNTER — Other Ambulatory Visit: Payer: Self-pay

## 2014-06-11 ENCOUNTER — Other Ambulatory Visit: Payer: Self-pay | Admitting: Dermatology

## 2014-06-25 LAB — OB RESULTS CONSOLE RUBELLA ANTIBODY, IGM: Rubella: IMMUNE

## 2014-06-25 LAB — OB RESULTS CONSOLE HEPATITIS B SURFACE ANTIGEN: Hepatitis B Surface Ag: NEGATIVE

## 2014-06-25 LAB — OB RESULTS CONSOLE ABO/RH: RH Type: POSITIVE

## 2014-06-25 LAB — OB RESULTS CONSOLE HIV ANTIBODY (ROUTINE TESTING)
HIV: NONREACTIVE
HIV: NONREACTIVE

## 2014-06-25 LAB — OB RESULTS CONSOLE ANTIBODY SCREEN: Antibody Screen: NEGATIVE

## 2014-07-13 LAB — OB RESULTS CONSOLE GC/CHLAMYDIA
Chlamydia: NEGATIVE
Gonorrhea: NEGATIVE

## 2014-11-08 LAB — OB RESULTS CONSOLE RPR: RPR: NONREACTIVE

## 2015-01-31 ENCOUNTER — Other Ambulatory Visit: Payer: Self-pay | Admitting: Obstetrics and Gynecology

## 2015-01-31 ENCOUNTER — Encounter (HOSPITAL_COMMUNITY): Payer: Self-pay | Admitting: Anesthesiology

## 2015-01-31 NOTE — Anesthesia Preprocedure Evaluation (Addendum)
Anesthesia Evaluation  Patient identified by MRN, date of birth, ID band Patient awake    Reviewed: Allergy & Precautions, NPO status , Patient's Chart, lab work & pertinent test results  Airway Mallampati: II  TM Distance: >3 FB Neck ROM: Full    Dental no notable dental hx. (+) Teeth Intact   Pulmonary neg pulmonary ROS,  breath sounds clear to auscultation  Pulmonary exam normal       Cardiovascular negative cardio ROS Normal cardiovascular examRhythm:Regular Rate:Normal     Neuro/Psych negative neurological ROS  negative psych ROS   GI/Hepatic Neg liver ROS, GERD-  ,  Endo/Other  Hypothyroidism   Renal/GU negative Renal ROS  negative genitourinary   Musculoskeletal negative musculoskeletal ROS (+)   Abdominal   Peds  Hematology negative hematology ROS (+)   Anesthesia Other Findings   Reproductive/Obstetrics (+) Pregnancy Breech presentation HPV                             Anesthesia Physical Anesthesia Plan  ASA: II  Anesthesia Plan: Spinal   Post-op Pain Management:    Induction:   Airway Management Planned: Natural Airway  Additional Equipment:   Intra-op Plan:   Post-operative Plan:   Informed Consent: I have reviewed the patients History and Physical, chart, labs and discussed the procedure including the risks, benefits and alternatives for the proposed anesthesia with the patient or authorized representative who has indicated his/her understanding and acceptance.     Plan Discussed with: CRNA, Anesthesiologist and Surgeon  Anesthesia Plan Comments:         Anesthesia Quick Evaluation

## 2015-01-31 NOTE — H&P (Signed)
Deanna CoupeHeather B Hicks is a 29 y.o. female presenting for primary csection for transverse lie at 39 weeks. Declines ECV  Maternal Medical History:  Contractions: Onset was less than 1 hour ago.   Frequency: rare.   Perceived severity is mild.    Fetal activity: Perceived fetal activity is normal.   Last perceived fetal movement was within the past hour.    Prenatal complications: Polyhydramnios.   Prenatal Complications - Diabetes: none.    OB History    Gravida Para Term Preterm AB TAB SAB Ectopic Multiple Living   0              Past Medical History  Diagnosis Date  . 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 Endometriosis    History reviewed. No pertinent past surgical history. Family History: family history includes Diabetes in her father; Endometriosis in her mother and sister; Heart disease in her father. Social History:  reports that she has never smoked. She has never used smokeless tobacco. She reports that she drinks alcohol. She reports that she does not use illicit drugs.   Prenatal Transfer Tool  Maternal Diabetes: No Genetic Screening: Normal Maternal Ultrasounds/Referrals: Normal Fetal Ultrasounds or other Referrals:  None Maternal Substance Abuse:  No Significant Maternal Medications:  None Significant Maternal Lab Results:  None Other Comments:  None  Review of Systems  Constitutional: Negative.   Eyes: Negative.   All other systems reviewed and are negative.     There were no vitals taken for this visit. Maternal Exam:  Uterine Assessment: Contraction strength is mild.  Contraction frequency is rare.   Abdomen: Patient reports no abdominal tenderness. Fetal presentation: no presenting part  Introitus: Normal vulva. Normal vagina.  Ferning test: negative.  Nitrazine test: negative. Amniotic fluid character: not assessed.  Pelvis: of concern for delivery.   Cervix: Cervix evaluated by digital exam.     Physical Exam  Nursing note and  vitals reviewed. Constitutional: She is oriented to person, place, and time. She appears well-developed and well-nourished.  HENT:  Head: Normocephalic and atraumatic.  Neck: Normal range of motion. Neck supple.  Cardiovascular: Normal rate and regular rhythm.   Respiratory: Effort normal and breath sounds normal.  GI: Soft. Bowel sounds are normal.  Genitourinary: Vagina normal and uterus normal.  Musculoskeletal: Normal range of motion.  Neurological: She is oriented to person, place, and time. She has normal reflexes.  Skin: Skin is warm.  Psychiatric: She has a normal mood and affect. Her behavior is normal. Judgment and thought content normal.    Prenatal labs: ABO, Rh: O/POS/O POS (09/02 1543) Antibody:   Rubella:   RPR:    HBsAg:    HIV:    GBS:     Assessment/Plan: Transverse lie Declines ECV Borderline pelvimetry 39 weeks Proceed with primary csection. Consent done.   Deanna Hicks J 01/31/2015, 9:11 PM

## 2015-02-01 ENCOUNTER — Inpatient Hospital Stay (HOSPITAL_COMMUNITY): Payer: BLUE CROSS/BLUE SHIELD | Admitting: Anesthesiology

## 2015-02-01 ENCOUNTER — Encounter (HOSPITAL_COMMUNITY): Payer: Self-pay | Admitting: Anesthesiology

## 2015-02-01 ENCOUNTER — Inpatient Hospital Stay (HOSPITAL_COMMUNITY)
Admission: AD | Admit: 2015-02-01 | Discharge: 2015-02-04 | DRG: 765 | Disposition: A | Discharge status: Home / Self Care | Payer: BLUE CROSS/BLUE SHIELD | Source: Ambulatory Visit | Attending: Obstetrics and Gynecology | Admitting: Obstetrics and Gynecology

## 2015-02-01 ENCOUNTER — Encounter (HOSPITAL_COMMUNITY)
Admission: AD | Disposition: A | Discharge status: Home / Self Care | Payer: Self-pay | Source: Ambulatory Visit | Attending: Obstetrics and Gynecology

## 2015-02-01 DIAGNOSIS — E039 Hypothyroidism, unspecified: Secondary | ICD-10-CM | POA: Diagnosis present

## 2015-02-01 DIAGNOSIS — D62 Acute posthemorrhagic anemia: Secondary | ICD-10-CM | POA: Diagnosis not present

## 2015-02-01 DIAGNOSIS — A630 Anogenital (venereal) warts: Secondary | ICD-10-CM | POA: Diagnosis present

## 2015-02-01 DIAGNOSIS — O9989 Other specified diseases and conditions complicating pregnancy, childbirth and the puerperium: Secondary | ICD-10-CM | POA: Diagnosis present

## 2015-02-01 DIAGNOSIS — O403XX0 Polyhydramnios, third trimester, not applicable or unspecified: Secondary | ICD-10-CM | POA: Diagnosis present

## 2015-02-01 DIAGNOSIS — Z3A39 39 weeks gestation of pregnancy: Secondary | ICD-10-CM | POA: Diagnosis present

## 2015-02-01 DIAGNOSIS — O99284 Endocrine, nutritional and metabolic diseases complicating childbirth: Secondary | ICD-10-CM | POA: Diagnosis present

## 2015-02-01 DIAGNOSIS — O9832 Other infections with a predominantly sexual mode of transmission complicating childbirth: Secondary | ICD-10-CM | POA: Diagnosis present

## 2015-02-01 DIAGNOSIS — O322XX0 Maternal care for transverse and oblique lie, not applicable or unspecified: Secondary | ICD-10-CM | POA: Diagnosis present

## 2015-02-01 DIAGNOSIS — O9081 Anemia of the puerperium: Secondary | ICD-10-CM | POA: Diagnosis not present

## 2015-02-01 DIAGNOSIS — N809 Endometriosis, unspecified: Secondary | ICD-10-CM | POA: Diagnosis present

## 2015-02-01 DIAGNOSIS — Z79899 Other long term (current) drug therapy: Secondary | ICD-10-CM

## 2015-02-01 LAB — TYPE AND SCREEN
ABO/RH(D): O POS
Antibody Screen: NEGATIVE

## 2015-02-01 LAB — CBC
HCT: 36.3 % (ref 36.0–46.0)
Hemoglobin: 12.9 g/dL (ref 12.0–15.0)
MCH: 32.1 pg (ref 26.0–34.0)
MCHC: 35.5 g/dL (ref 30.0–36.0)
MCV: 90.3 fL (ref 78.0–100.0)
Platelets: 257 10*3/uL (ref 150–400)
RBC: 4.02 MIL/uL (ref 3.87–5.11)
RDW: 13.3 % (ref 11.5–15.5)
WBC: 9.6 10*3/uL (ref 4.0–10.5)

## 2015-02-01 LAB — ABO/RH: ABO/RH(D): O POS

## 2015-02-01 SURGERY — Surgical Case
Anesthesia: Spinal | Site: Abdomen

## 2015-02-01 MED ORDER — OXYTOCIN 40 UNITS IN LACTATED RINGERS INFUSION - SIMPLE MED: 62.5000 mL/h | INTRAVENOUS | Status: AC

## 2015-02-01 MED ORDER — OXYTOCIN 10 UNIT/ML IJ SOLN
INTRAMUSCULAR | Status: AC
  Filled 2015-02-01: qty 4

## 2015-02-01 MED ORDER — DIPHENHYDRAMINE HCL 50 MG/ML IJ SOLN: 12.5000 mg | INTRAMUSCULAR | Status: DC | PRN

## 2015-02-01 MED ORDER — NALBUPHINE HCL 10 MG/ML IJ SOLN: 5.0000 mg | INTRAMUSCULAR | Status: DC | PRN

## 2015-02-01 MED ORDER — OXYCODONE-ACETAMINOPHEN 5-325 MG PO TABS
2.0000 | ORAL_TABLET | ORAL | Status: DC | PRN
  Administered 2015-02-04 (×2): 2 via ORAL
  Filled 2015-02-01 (×2): qty 2

## 2015-02-01 MED ORDER — MORPHINE SULFATE (PF) 0.5 MG/ML IJ SOLN
INTRAMUSCULAR | Status: DC | PRN
  Administered 2015-02-01: .15 ug via INTRATHECAL

## 2015-02-01 MED ORDER — IBUPROFEN 600 MG PO TABS
600.0000 mg | ORAL_TABLET | Freq: Four times a day (QID) | ORAL | Status: DC
  Administered 2015-02-01 – 2015-02-04 (×11): 600 mg via ORAL
  Filled 2015-02-01 (×11): qty 1

## 2015-02-01 MED ORDER — CEFAZOLIN SODIUM-DEXTROSE 2-3 GM-% IV SOLR
INTRAVENOUS | Status: AC
  Filled 2015-02-01: qty 50

## 2015-02-01 MED ORDER — DIPHENHYDRAMINE HCL 25 MG PO CAPS: 25.0000 mg | ORAL_CAPSULE | ORAL | Status: DC | PRN

## 2015-02-01 MED ORDER — SIMETHICONE 80 MG PO CHEW
80.0000 mg | CHEWABLE_TABLET | Freq: Three times a day (TID) | ORAL | Status: DC
  Administered 2015-02-02 – 2015-02-04 (×6): 80 mg via ORAL
  Filled 2015-02-01 (×6): qty 1

## 2015-02-01 MED ORDER — LACTATED RINGERS IV SOLN
INTRAVENOUS | Status: DC
  Administered 2015-02-01 (×3): via INTRAVENOUS

## 2015-02-01 MED ORDER — SCOPOLAMINE 1 MG/3DAYS TD PT72
MEDICATED_PATCH | TRANSDERMAL | Status: AC
  Administered 2015-02-01: 1.5 mg via TRANSDERMAL
  Filled 2015-02-01: qty 1

## 2015-02-01 MED ORDER — NALBUPHINE HCL 10 MG/ML IJ SOLN: 5.0000 mg | Freq: Once | INTRAMUSCULAR | Status: AC | PRN

## 2015-02-01 MED ORDER — SENNOSIDES-DOCUSATE SODIUM 8.6-50 MG PO TABS
2.0000 | ORAL_TABLET | ORAL | Status: DC
  Administered 2015-02-01 – 2015-02-03 (×3): 2 via ORAL
  Filled 2015-02-01 (×3): qty 2

## 2015-02-01 MED ORDER — ACETAMINOPHEN 325 MG PO TABS: 650.0000 mg | ORAL_TABLET | ORAL | Status: DC | PRN

## 2015-02-01 MED ORDER — FENTANYL CITRATE (PF) 100 MCG/2ML IJ SOLN
INTRAMUSCULAR | Status: DC | PRN
  Administered 2015-02-01: 25 ug via INTRATHECAL

## 2015-02-01 MED ORDER — SCOPOLAMINE 1 MG/3DAYS TD PT72
1.0000 | MEDICATED_PATCH | Freq: Once | TRANSDERMAL | Status: DC
  Administered 2015-02-01: 1.5 mg via TRANSDERMAL

## 2015-02-01 MED ORDER — PRENATAL MULTIVITAMIN CH
1.0000 | ORAL_TABLET | Freq: Every day | ORAL | Status: DC
  Administered 2015-02-02 – 2015-02-04 (×3): 1 via ORAL
  Filled 2015-02-01 (×3): qty 1

## 2015-02-01 MED ORDER — SCOPOLAMINE 1 MG/3DAYS TD PT72: 1.0000 | MEDICATED_PATCH | Freq: Once | TRANSDERMAL | Status: DC

## 2015-02-01 MED ORDER — WITCH HAZEL-GLYCERIN EX PADS: 1.0000 "application " | MEDICATED_PAD | CUTANEOUS | Status: DC | PRN

## 2015-02-01 MED ORDER — MENTHOL 3 MG MT LOZG: 1.0000 | LOZENGE | OROMUCOSAL | Status: DC | PRN

## 2015-02-01 MED ORDER — LACTATED RINGERS IV SOLN: INTRAVENOUS | Status: DC

## 2015-02-01 MED ORDER — BUPIVACAINE HCL (PF) 0.5 % IJ SOLN
INTRAMUSCULAR | Status: DC | PRN
  Administered 2015-02-01: 9 mg

## 2015-02-01 MED ORDER — PHENYLEPHRINE 8 MG IN D5W 100 ML (0.08MG/ML) PREMIX OPTIME
INJECTION | INTRAVENOUS | Status: AC
  Filled 2015-02-01: qty 100

## 2015-02-01 MED ORDER — NALOXONE HCL 0.4 MG/ML IJ SOLN: 0.4000 mg | INTRAMUSCULAR | Status: DC | PRN

## 2015-02-01 MED ORDER — FENTANYL CITRATE (PF) 100 MCG/2ML IJ SOLN
INTRAMUSCULAR | Status: AC
  Filled 2015-02-01: qty 2

## 2015-02-01 MED ORDER — LEVOTHYROXINE SODIUM 100 MCG PO TABS
100.0000 ug | ORAL_TABLET | Freq: Every day | ORAL | Status: DC
  Administered 2015-02-02: 100 ug via ORAL
  Filled 2015-02-01: qty 1

## 2015-02-01 MED ORDER — BUPIVACAINE LIPOSOME 1.3 % IJ SUSP
20.0000 mL | Freq: Once | INTRAMUSCULAR | Status: DC
  Filled 2015-02-01: qty 20

## 2015-02-01 MED ORDER — SIMETHICONE 80 MG PO CHEW
80.0000 mg | CHEWABLE_TABLET | ORAL | Status: DC
  Administered 2015-02-01 – 2015-02-03 (×3): 80 mg via ORAL
  Filled 2015-02-01 (×3): qty 1

## 2015-02-01 MED ORDER — IBUPROFEN 600 MG PO TABS: 600.0000 mg | ORAL_TABLET | Freq: Four times a day (QID) | ORAL | Status: DC | PRN

## 2015-02-01 MED ORDER — FENTANYL CITRATE (PF) 100 MCG/2ML IJ SOLN
25.0000 ug | INTRAMUSCULAR | Status: DC | PRN
  Administered 2015-02-01: 50 ug via INTRAVENOUS

## 2015-02-01 MED ORDER — KETOROLAC TROMETHAMINE 30 MG/ML IJ SOLN
INTRAMUSCULAR | Status: AC
  Filled 2015-02-01: qty 1

## 2015-02-01 MED ORDER — LACTATED RINGERS IV SOLN
INTRAVENOUS | Status: DC | PRN
  Administered 2015-02-01: 16:00:00 via INTRAVENOUS

## 2015-02-01 MED ORDER — SODIUM CHLORIDE 0.9 % IJ SOLN: 3.0000 mL | INTRAMUSCULAR | Status: DC | PRN

## 2015-02-01 MED ORDER — KETOROLAC TROMETHAMINE 30 MG/ML IJ SOLN
30.0000 mg | Freq: Four times a day (QID) | INTRAMUSCULAR | Status: AC | PRN
Start: 2015-02-01 — End: 2015-02-02
  Administered 2015-02-01: 30 mg via INTRAMUSCULAR

## 2015-02-01 MED ORDER — ZOLPIDEM TARTRATE 5 MG PO TABS: 5.0000 mg | ORAL_TABLET | Freq: Every evening | ORAL | Status: DC | PRN

## 2015-02-01 MED ORDER — DIBUCAINE 1 % RE OINT: 1.0000 "application " | TOPICAL_OINTMENT | RECTAL | Status: DC | PRN

## 2015-02-01 MED ORDER — MEPERIDINE HCL 25 MG/ML IJ SOLN: 6.2500 mg | INTRAMUSCULAR | Status: DC | PRN

## 2015-02-01 MED ORDER — NALOXONE HCL 1 MG/ML IJ SOLN
1.0000 ug/kg/h | INTRAVENOUS | Status: DC | PRN
  Filled 2015-02-01: qty 2

## 2015-02-01 MED ORDER — ONDANSETRON HCL 4 MG/2ML IJ SOLN: 4.0000 mg | Freq: Three times a day (TID) | INTRAMUSCULAR | Status: DC | PRN

## 2015-02-01 MED ORDER — TETANUS-DIPHTH-ACELL PERTUSSIS 5-2.5-18.5 LF-MCG/0.5 IM SUSP: 0.5000 mL | Freq: Once | INTRAMUSCULAR | Status: DC

## 2015-02-01 MED ORDER — BUPIVACAINE HCL (PF) 0.25 % IJ SOLN
INTRAMUSCULAR | Status: AC
  Filled 2015-02-01: qty 30

## 2015-02-01 MED ORDER — KETOROLAC TROMETHAMINE 30 MG/ML IJ SOLN: 30.0000 mg | Freq: Four times a day (QID) | INTRAMUSCULAR | Status: AC | PRN

## 2015-02-01 MED ORDER — PHENYLEPHRINE HCL 10 MG/ML IJ SOLN
INTRAMUSCULAR | Status: DC | PRN
  Administered 2015-02-01: 160 ug via INTRAVENOUS

## 2015-02-01 MED ORDER — BUPIVACAINE LIPOSOME 1.3 % IJ SUSP
INTRAMUSCULAR | Status: DC | PRN
  Administered 2015-02-01: 20 mL

## 2015-02-01 MED ORDER — BUPIVACAINE HCL (PF) 0.25 % IJ SOLN
INTRAMUSCULAR | Status: DC | PRN
  Administered 2015-02-01: 30 mL

## 2015-02-01 MED ORDER — ONDANSETRON HCL 4 MG/2ML IJ SOLN
INTRAMUSCULAR | Status: AC
  Filled 2015-02-01: qty 2

## 2015-02-01 MED ORDER — DIPHENHYDRAMINE HCL 25 MG PO CAPS: 25.0000 mg | ORAL_CAPSULE | Freq: Four times a day (QID) | ORAL | Status: DC | PRN

## 2015-02-01 MED ORDER — LANOLIN HYDROUS EX OINT: 1.0000 "application " | TOPICAL_OINTMENT | CUTANEOUS | Status: DC | PRN

## 2015-02-01 MED ORDER — PHENYLEPHRINE 8 MG IN D5W 100 ML (0.08MG/ML) PREMIX OPTIME
INJECTION | INTRAVENOUS | Status: DC | PRN
  Administered 2015-02-01: 60 ug/min via INTRAVENOUS

## 2015-02-01 MED ORDER — ONDANSETRON HCL 4 MG/2ML IJ SOLN
INTRAMUSCULAR | Status: DC | PRN
  Administered 2015-02-01: 4 mg via INTRAVENOUS

## 2015-02-01 MED ORDER — SIMETHICONE 80 MG PO CHEW: 80.0000 mg | CHEWABLE_TABLET | ORAL | Status: DC | PRN

## 2015-02-01 MED ORDER — METHYLERGONOVINE MALEATE 0.2 MG PO TABS: 0.2000 mg | ORAL_TABLET | ORAL | Status: DC | PRN

## 2015-02-01 MED ORDER — METHYLERGONOVINE MALEATE 0.2 MG/ML IJ SOLN: 0.2000 mg | INTRAMUSCULAR | Status: DC | PRN

## 2015-02-01 MED ORDER — CEFAZOLIN SODIUM-DEXTROSE 2-3 GM-% IV SOLR
2.0000 g | INTRAVENOUS | Status: AC
  Administered 2015-02-01: 2 g via INTRAVENOUS

## 2015-02-01 MED ORDER — MORPHINE SULFATE 0.5 MG/ML IJ SOLN
INTRAMUSCULAR | Status: AC
  Filled 2015-02-01: qty 10

## 2015-02-01 MED ORDER — OXYCODONE-ACETAMINOPHEN 5-325 MG PO TABS
1.0000 | ORAL_TABLET | ORAL | Status: DC | PRN
  Administered 2015-02-02 – 2015-02-03 (×8): 1 via ORAL
  Filled 2015-02-01 (×7): qty 1

## 2015-02-01 SURGICAL SUPPLY — 33 items
CLAMP CORD UMBIL (MISCELLANEOUS) IMPLANT
CLOTH BEACON ORANGE TIMEOUT ST (SAFETY) ×2 IMPLANT
CONTAINER PREFILL 10% NBF 15ML (MISCELLANEOUS) IMPLANT
DRAPE SHEET LG 3/4 BI-LAMINATE (DRAPES) IMPLANT
DRSG OPSITE POSTOP 4X10 (GAUZE/BANDAGES/DRESSINGS) ×2 IMPLANT
DURAPREP 26ML APPLICATOR (WOUND CARE) ×2 IMPLANT
ELECT REM PT RETURN 9FT ADLT (ELECTROSURGICAL) ×2
ELECTRODE REM PT RTRN 9FT ADLT (ELECTROSURGICAL) ×1 IMPLANT
EXTRACTOR VACUUM M CUP 4 TUBE (SUCTIONS) IMPLANT
GLOVE BIO SURGEON STRL SZ7.5 (GLOVE) ×2 IMPLANT
GOWN STRL REUS W/TWL LRG LVL3 (GOWN DISPOSABLE) ×4 IMPLANT
KIT ABG SYR 3ML LUER SLIP (SYRINGE) IMPLANT
NDL HYPO 25X5/8 SAFETYGLIDE (NEEDLE) IMPLANT
NDL SPNL 20GX3.5 QUINCKE YW (NEEDLE) IMPLANT
NEEDLE HYPO 22GX1.5 SAFETY (NEEDLE) ×2 IMPLANT
NEEDLE HYPO 25X5/8 SAFETYGLIDE (NEEDLE) IMPLANT
NEEDLE SPNL 20GX3.5 QUINCKE YW (NEEDLE) IMPLANT
NS IRRIG 1000ML POUR BTL (IV SOLUTION) ×2 IMPLANT
PACK C SECTION WH (CUSTOM PROCEDURE TRAY) ×2 IMPLANT
STAPLER VISISTAT 35W (STAPLE) IMPLANT
SUT MNCRL 0 VIOLET CTX 36 (SUTURE) ×2 IMPLANT
SUT MNCRL AB 3-0 PS2 27 (SUTURE) IMPLANT
SUT MON AB 2-0 CT1 27 (SUTURE) ×2 IMPLANT
SUT MON AB-0 CT1 36 (SUTURE) ×4 IMPLANT
SUT MONOCRYL 0 CTX 36 (SUTURE) ×2
SUT PLAIN 0 NONE (SUTURE) IMPLANT
SUT PLAIN 2 0 (SUTURE)
SUT PLAIN 2 0 XLH (SUTURE) IMPLANT
SUT PLAIN ABS 2-0 CT1 27XMFL (SUTURE) IMPLANT
SYR 20CC LL (SYRINGE) IMPLANT
SYR CONTROL 10ML LL (SYRINGE) ×2 IMPLANT
TOWEL OR 17X24 6PK STRL BLUE (TOWEL DISPOSABLE) ×2 IMPLANT
TRAY FOLEY CATH SILVER 14FR (SET/KITS/TRAYS/PACK) ×2 IMPLANT

## 2015-02-01 NOTE — Progress Notes (Signed)
Patient ID: Deanna Hicks, female   DOB: 02-Feb-1986, 29 y.o.   MRN: [id number] Patient seen and examined. Consent witnessed and signed. No changes noted. Update completed.

## 2015-02-01 NOTE — Anesthesia Postprocedure Evaluation (Signed)
  Anesthesia Post-op Note  Patient: Deanna CoupeHeather B Rottman  Procedure(s) Performed: Procedure(s) with comments: Primary CESAREAN SECTION (N/A) - EDD: 02/02/15. approved by Myra  Patient Location: PACU  Anesthesia Type:Spinal  Level of Consciousness: awake, alert  and oriented  Airway and Oxygen Therapy: Patient Spontanous Breathing  Post-op Pain: none  Post-op Assessment: Post-op Vital signs reviewed, Patient's Cardiovascular Status Stable, Respiratory Function Stable, Patent Airway, No signs of Nausea or vomiting, Pain level controlled, No headache and No backache  Post-op Vital Signs: Reviewed and stable  Last Vitals:  Filed Vitals:   02/01/15 1715  BP: 113/67  Pulse: 87  Temp:   Resp: 13    Complications: No apparent anesthesia complications

## 2015-02-01 NOTE — Transfer of Care (Signed)
Immediate Anesthesia Transfer of Care Note  Patient: Deanna Hicks  Procedure(s) Performed: Procedure(s) with comments: Primary CESAREAN SECTION (N/A) - EDD: 02/02/15. approved by Myra  Patient Location: PACU  Anesthesia Type:Spinal  Level of Consciousness: awake, alert  and oriented  Airway & Oxygen Therapy: Patient Spontanous Breathing  Post-op Assessment: Report given to RN and Post -op Vital signs reviewed and stable  Post vital signs: Reviewed and stable  Last Vitals:  Filed Vitals:   02/01/15 1454  BP: 117/73  Pulse: 96  Temp: 37.1 C  Resp: 18    Complications: No apparent anesthesia complications

## 2015-02-01 NOTE — Op Note (Signed)
Cesarean Section Procedure Note  Indications: malpresentation: transverse lie  Pre-operative Diagnosis: 39 week 2 day pregnancy.  Post-operative Diagnosis: same  Surgeon: Lenoard AdenAAVON,Anberlin Diez J   Assistants: Arita Missawson, CNM  Anesthesia: Local anesthesia 0.25.% bupivacaine and Spinal anesthesia  ASA Class: 2  Procedure Details  The patient was seen in the Holding Room. The risks, benefits, complications, treatment options, and expected outcomes were discussed with the patient.  The patient concurred with the proposed plan, giving informed consent. The risks of anesthesia, infection, bleeding and possible injury to other organs discussed. Injury to bowel, bladder, or ureter with possible need for repair discussed. Possible need for transfusion with secondary risks of hepatitis or HIV acquisition discussed. Post operative complications to include but not limited to DVT, PE and Pneumonia noted. The site of surgery properly noted/marked. The patient was taken to Operating Room # 9, identified as Deanna Hicks and the procedure verified as C-Section Delivery. A Time Out was held and the above information confirmed.  After induction of anesthesia, the patient was draped and prepped in the usual sterile manner. A Pfannenstiel incision was made and carried down through the subcutaneous tissue to the fascia. Fascial incision was made and extended transversely using Mayo scissors. The fascia was separated from the underlying rectus tissue superiorly and inferiorly. The peritoneum was identified and entered. Peritoneal incision was extended longitudinally. The utero-vesical peritoneal reflection was incised transversely and the bladder flap was bluntly freed from the lower uterine segment. A low transverse uterine incision(Kerr hysterotomy) was made. Delivered from right transverse lie  presentation was a  Female with Apgar scores of 8 at one minute and 9 at five minutes. Bulb suctioning gently performed. Neonatal  team in attendance.After the umbilical cord was clamped and cut cord blood was obtained for evaluation. The placenta was removed intact and appeared normal. The uterus was curetted with a dry lap pack. Good hemostasis was noted.The uterine outline, tubes and ovaries appeared normal. The uterine incision was closed with running locked sutures of 0 Monocryl x 2 layers. Hemostasis was observed. Lavage was carried out until clear.The parietal peritoneum was closed with a running 2-0 Monocryl suture. The fascia was then reapproximated with running sutures of 0 Monocryl. The skin was reapproximated with 3-0 monocryl after [place] closure with 2-0 plain.  Instrument, sponge, and needle counts were correct prior the abdominal closure and at the conclusion of the case.   Findings: FTLF, transverse lie, anterior placenta  Estimated Blood Loss:  300 mL         Drains: foley                 Specimens: placenta                 Complications:  None; patient tolerated the procedure well.         Disposition: PACU - hemodynamically stable.         Condition: stable  Attending Attestation: I performed the procedure.

## 2015-02-01 NOTE — Anesthesia Procedure Notes (Signed)
Spinal Patient location during procedure: OR Start time: 02/01/2015 3:55 PM Staffing Anesthesiologist: Mal AmabileFOSTER, Deanna Mccleese Performed by: anesthesiologist  Preanesthetic Checklist Completed: patient identified, site marked, surgical consent, pre-op evaluation, timeout performed, IV checked, risks and benefits discussed and monitors and equipment checked Spinal Block Patient position: sitting Prep: site prepped and draped and DuraPrep Patient monitoring: heart rate, cardiac monitor, continuous pulse ox and blood pressure Approach: midline Location: L3-4 Injection technique: single-shot Needle Needle type: Sprotte  Needle gauge: 24 G Needle length: 9 cm Assessment Sensory level: T4 Additional Notes Patient tolerated procedure well. Adequate sensory level.

## 2015-02-02 ENCOUNTER — Encounter (HOSPITAL_COMMUNITY): Payer: Self-pay | Admitting: Obstetrics and Gynecology

## 2015-02-02 DIAGNOSIS — D62 Acute posthemorrhagic anemia: Secondary | ICD-10-CM | POA: Diagnosis not present

## 2015-02-02 LAB — CBC
HCT: 28.2 % — ABNORMAL LOW (ref 36.0–46.0)
Hemoglobin: 10 g/dL — ABNORMAL LOW (ref 12.0–15.0)
MCH: 32.4 pg (ref 26.0–34.0)
MCHC: 35.5 g/dL (ref 30.0–36.0)
MCV: 91.3 fL (ref 78.0–100.0)
Platelets: 202 10*3/uL (ref 150–400)
RBC: 3.09 MIL/uL — ABNORMAL LOW (ref 3.87–5.11)
RDW: 13.5 % (ref 11.5–15.5)
WBC: 9.6 10*3/uL (ref 4.0–10.5)

## 2015-02-02 LAB — BIRTH TISSUE RECOVERY COLLECTION (PLACENTA DONATION)

## 2015-02-02 LAB — RPR: RPR Ser Ql: NONREACTIVE

## 2015-02-02 MED ORDER — LEVOTHYROXINE SODIUM 100 MCG PO TABS
100.0000 ug | ORAL_TABLET | Freq: Every day | ORAL | Status: DC
  Administered 2015-02-03 – 2015-02-04 (×2): 100 ug via ORAL
  Filled 2015-02-02 (×2): qty 1

## 2015-02-02 NOTE — Anesthesia Postprocedure Evaluation (Signed)
  Anesthesia Post-op Note  Patient: Deanna Hicks  Procedure(s) Performed: Procedure(s) with comments: Primary CESAREAN SECTION (N/A) - EDD: 02/02/15. approved by Myra  Patient Location: Mother/Baby  Anesthesia Type:Spinal  Level of Consciousness: awake  Airway and Oxygen Therapy: Patient Spontanous Breathing  Post-op Pain: mild  Post-op Assessment: Patient's Cardiovascular Status Stable and Respiratory Function Stable              Post-op Vital Signs: stable  Last Vitals:  Filed Vitals:   02/02/15 1003  BP: 114/56  Pulse: 83  Temp: 37.1 C  Resp:     Complications: No apparent anesthesia complications

## 2015-02-02 NOTE — Progress Notes (Signed)
POD # 1  Subjective: Pt reports feeling well/ Pain controlled with Motrin and Percocet Tolerating po/ Foley d/c'd and voiding without problems/ No n/v/ Flatus absent Activity: ad lib Bleeding is light Newborn info:  Information for the patient's newborn:  Gerlene FeeWalters, Girl Ensley [[id number]][[id number]]  female   Feeding: breast   Objective:  VS:  Filed Vitals:   02/01/15 2206 02/01/15 2222 02/02/15 0204 02/02/15 0601  BP: 103/55 85/52 100/64 111/70  Pulse: 82 92 77 79  Temp:   98.7 F (37.1 C) 97.8 F (36.6 C)  TempSrc:   Oral Oral  Resp: 16 16 16 16   Height:      Weight:      SpO2:   98% 98%     I&O: Intake/Output      06/07 0701 - 06/08 0700 06/08 0701 - 06/09 0700   P.O. 720 900   I.V. (mL/kg) 2600 (36.1)    Total Intake(mL/kg) 3320 (46) 900 (12.5)   Urine (mL/kg/hr) 1110 1000 (5.1)   Blood 700    Total Output 1810 1000   Net +1510 -100           Recent Labs  02/01/15 1452 02/02/15 0528  WBC 9.6 9.6  HGB 12.9 10.0*  HCT 36.3 28.2*  PLT 257 202    Blood type: --/--/O POS, O POS (06/07 1452) Rubella: Immune (10/30 0000)    Physical Exam:  General: alert, cooperative and no distress CV: Regular rate and rhythm Resp: CTA bilaterally Abdomen: soft, nontender, normal bowel sounds Incision: Covered with Tegaderm and honeycomb dressing; no significant drainage, edema, bruising, or erythema; well approximated with suture Uterine Fundus: firm, below umbilicus, nontender Lochia: minimal Ext: extremities normal, atraumatic, no cyanosis or edema and Homans sign is negative, no sign of DVT    Assessment: POD # 1/ G1P1/ S/P C/Section d/t transverse lie  ABL anemia Hypothyroidism-stable Doing well  Plan: Ambulate Continue routine post op orders   Signed: Donette LarryBHAMBRI, Minerva Bluett, N, MSN, CNM 02/02/2015, 9:45 AM

## 2015-02-02 NOTE — Addendum Note (Signed)
Addendum  created 02/02/15 1238 by Renford DillsJanet L Suzie Vandam, CRNA   Modules edited: Notes Section   Notes Section:  File: [id number]

## 2015-02-03 NOTE — Lactation Note (Signed)
This note was copied from the chart of Deanna Jaeden Howser. Lactation Consultation Note; Mom reports that baby is latching well to the left breast but she is having some trouble getting latched to the right- reports nipple is tender and looks like it has a ridge on it when the baby comes off the breast. Mom has been using cradle hold. Assisted mom with latch in football hold and reviewed keeping the baby close to the breast throughout the feeding. Baby tucks bottom lip under- adjusted and mom reports that feels better. Comfort gels given with instructions for use and encouraged to rub EBM into nipples after nursing. No further questions at present. To call for assist prn  Patient Name: Deanna Hicks [id number] Date: 02/03/2015 Reason for consult: Follow-up assessment   Maternal Data Formula Feeding for Exclusion: No Has patient been taught Hand Expression?: Yes Does the patient have breastfeeding experience prior to this delivery?: No  Feeding Feeding Type: Breast Fed Length of feed: 10 min  LATCH Score/Interventions Latch: Grasps breast easily, tongue down, lips flanged, rhythmical sucking.  Audible Swallowing: A few with stimulation  Type of Nipple: Everted at rest and after stimulation  Comfort (Breast/Nipple): Filling, red/small blisters or bruises, mild/mod discomfort  Problem noted: Mild/Moderate discomfort Interventions (Mild/moderate discomfort): Comfort gels  Hold (Positioning): Assistance needed to correctly position infant at breast and maintain latch. Intervention(s): Breastfeeding basics reviewed;Support Pillows;Position options  LATCH Score: 7  Lactation Tools Discussed/Used Tools: Comfort gels   Consult Status Consult Status: Follow-up Date: 02/04/15 Follow-up type: In-patient    Pamelia Hoit 02/03/2015, 2:25 PM

## 2015-02-03 NOTE — Progress Notes (Signed)
POD # 2 S/P Cesarean Delivery d/t malpresentation - transverse lie  Subjective: Pt reports feeling well/ Pain controlled with Motrin and Percocet Tolerating po/ Foley d/c'd and voiding without problems/ No n/v/ Flatus (+). No BM. Activity: ad lib Bleeding is light Newborn info:  Information for the patient's newborn:  Aleea, Keef [[id number]]  female    Feeding: breast   Objective:  VS:  Filed Vitals:   02/02/15 1003 02/02/15 1424 02/02/15 1820 02/03/15 0630  BP: 114/56 110/64 96/53 111/64  Pulse: 83 75 80 94  Temp: 98.7 F (37.1 C) 98 F (36.7 C) 98.4 F (36.9 C) 98.1 F (36.7 C)  TempSrc: Oral Oral Oral   Resp:  18 18 18   Height:      Weight:      SpO2: 99%        I&O: Intake/Output      06/08 0701 - 06/09 0700 06/09 0701 - 06/10 0700   P.O. 900    I.V. (mL/kg) 1400 (19.4)    Total Intake(mL/kg) 2300 (31.9)    Urine (mL/kg/hr) 2000 (1.2)    Blood     Total Output 2000     Net +300             Recent Labs  02/01/15 1452 02/02/15 0528  WBC 9.6 9.6  HGB 12.9 10.0*  HCT 36.3 28.2*  PLT 257 202    Blood type: --/--/O POS, O POS (06/07 1452) Rubella: Immune (10/30 0000)    Physical Exam:  General: alert, cooperative and no distress CV: Regular rate and rhythm Resp: CTA bilaterally Abdomen: soft, nontender, normal bowel sounds Incision: Covered with Tegaderm and honeycomb dressing; no significant drainage, edema, bruising, or erythema; well approximated with suture Uterine Fundus: firm, 2 FB below umbilicus, nontender Lochia: minimal Ext: extremities normal, atraumatic, no cyanosis or edema and Homans sign is negative, no sign of DVT    Assessment: POD # 2 / G1P1/ S/P C/Section d/t transverse lie  ABL anemia Hypothyroidism-stable  Doing well  Plan: Ambulate Continue routine post op orders Anticipate d/c home tomorrow  Signed: Kenard Gower, MSN, CNM 02/03/2015, 8:41 AM

## 2015-02-04 MED ORDER — IBUPROFEN 600 MG PO TABS: 600.0000 mg | ORAL_TABLET | Freq: Four times a day (QID) | ORAL | Status: DC

## 2015-02-04 MED ORDER — OXYCODONE-ACETAMINOPHEN 5-325 MG PO TABS: 1.0000 | ORAL_TABLET | ORAL | Status: DC | PRN

## 2015-02-04 NOTE — Discharge Summary (Signed)
POSTOPERATIVE DISCHARGE SUMMARY:  Patient ID: Deanna Hicks MRN: [id number] DOB/AGE: 1986/02/08 29 y.o.  Admit date: 02/01/2015 Admission Diagnoses: 39 weeks / transverse lie / hypothyroidism  Discharge date: 02/04/2015   Discharge Diagnoses: POD 3 s/p cesarean section / mild ABL anemia / hypothyroidism  Prenatal history: G1P1   EDC : 02/02/2015 Prenatal care at [facility]  Primary provider : Dr Billy Coast Prenatal course complicated by hypothyroidism / transverse lie  Prenatal Labs: ABO, Rh: --/--/O POS, O POS (06/07 1452)  Antibody: NEG (06/07 1452) Rubella: Immune (10/30 0000)  RPR: Non Reactive (06/07 1452)  HBsAg: Negative (10/30 0000)  HIV: Non-reactive, Non-reactive (10/30 0000)  GTT : NL GBS:     Medical / Surgical History :  Past medical history:  Past Medical History  Diagnosis Date  . 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 Endometriosis   . Postpartum care following cesarean delivery (6/7) 02/01/2015    Past surgical history:  Past Surgical History  Procedure Laterality Date  . Cesarean section N/A 02/01/2015    Procedure: Primary CESAREAN SECTION;  Surgeon: Olivia Mackie, MD;  Location: [facility];  Service: Obstetrics;  Laterality: N/A;  EDD: 02/02/15. approved by Myra    Family History:  Family History  Problem Relation Age of Onset  . Endometriosis Mother   . Diabetes Father   . Heart disease Father     passed away at age 27  . Endometriosis Sister     Social History:  reports that she has never smoked. She has never used smokeless tobacco. She reports that she drinks alcohol. She reports that she does not use illicit drugs.  Allergies: Review of patient's allergies indicates no known allergies.   Current Medications at time of admission:  Prior to Admission medications   Medication Sig Start Date End Date Taking? Authorizing Provider  Docosahexaenoic Acid (DHA PO) Take 1 tablet by mouth daily.   Yes Historical Provider, MD   levothyroxine (SYNTHROID, LEVOTHROID) 100 MCG tablet Take 100 mcg by mouth daily before breakfast.   Yes Historical Provider, MD  Prenatal Multivit-Min-Fe-FA (PRENATAL VITAMINS PO) Take 1 tablet by mouth 3 (three) times daily.    Yes Historical Provider, MD  ibuprofen (ADVIL,MOTRIN) 600 MG tablet Take 1 tablet (600 mg total) by mouth every 6 (six) hours. 02/04/15   Marlinda Mike, CNM  oxyCODONE-acetaminophen (PERCOCET/ROXICET) 5-325 MG per tablet Take 1 tablet by mouth every 4 (four) hours as needed (for pain scale 4-7). 02/04/15   Marlinda Mike, CNM    Intrapartum Course:  Procedures: Cesarean section delivery on 02/01/2015 with delivery of viable female newborn by Dr Billy Coast   See operative report for further details APGAR (1 MIN): 9   APGAR (5 MINS): 9    Postoperative / postpartum course:  Uncomplicated with discharge on POD 3   Discharge Instructions:  Discharged Condition: stable  Activity: pelvic rest and postoperative restrictions x 2   Diet: routine  Medications:    Medication List    TAKE these medications        DHA PO  Take 1 tablet by mouth daily.     ibuprofen 600 MG tablet  Commonly known as:  ADVIL,MOTRIN  Take 1 tablet (600 mg total) by mouth every 6 (six) hours.     levothyroxine 100 MCG tablet  Commonly known as:  SYNTHROID, LEVOTHROID  Take 100 mcg by mouth daily before breakfast.     oxyCODONE-acetaminophen 5-325 MG per tablet  Commonly known as:  PERCOCET/ROXICET  Take 1 tablet by mouth every 4 (four) hours as needed (for pain scale 4-7).     PRENATAL VITAMINS PO  Take 1 tablet by mouth 3 (three) times daily.        Wound Care: keep clean and dry / remove honeycomb POD 5 Postpartum Instructions: [facility] discharge booklet - instructions reviewed  Discharge to: Home  Follow up :   [facility] in 6 weeks for routine postpartum visit with Dr Billy Coast                Signed: Marlinda Mike CNM, MSN, [facility] 02/04/2015, 9:07 AM

## 2015-02-04 NOTE — Progress Notes (Signed)
POSTOPERATIVE DAY # 3 S/P CS   S:         Reports feeling well - sore and tired             Tolerating po intake / no nausea / no vomiting / + flatus / no BM             Bleeding is light             Pain controlled with motrin and percocet             Up ad lib / ambulatory/ voiding QS  Newborn breast feeding  / female  O:  VS: BP 111/66 mmHg  Pulse 81  Temp(Src) 98.2 F (36.8 C) (Oral)  Resp 18  Ht 4\' 11"  (1.499 m)  Wt 72.122 kg (159 lb)  BMI 32.10 kg/m2  SpO2 99%  LMP 03/21/2014  Breastfeeding? Unknown   LABS:               Recent Labs  02/01/15 1452 02/02/15 0528  WBC 9.6 9.6  HGB 12.9 10.0*  PLT 257 202               Bloodtype: --/--/O POS, O POS (06/07 1452)  Rubella: Immune (10/30 0000)                                 Physical Exam:             Alert and Oriented X3  Lungs: Clear and unlabored  Heart: regular rate and rhythm / no mumurs  Abdomen: soft, non-tender, non-distended, active BS             Fundus: firm, non-tender, U-1             Dressing intact honeycomb              Incision:  approximated with suture / no erythema / no ecchymosis / no drainage  Perineum: intact  Lochia: light  Extremities: trace edema, no calf pain or tenderness, negative Homans  A:        POD # 3 S/P CS            Mild ABL anemia - stable  P:        Routine postoperative care              Dc home - [facility] booklet - instructions reviewed     Marlinda Mike CNM, MSN, FACNM 02/04/2015, [id number]

## 2015-02-04 NOTE — Discharge Instructions (Signed)
Tea bags to nipples for comfort with cracks / coconut oil or mother balm to nipples INCREASE water intake daily

## 2015-02-04 NOTE — Lactation Note (Signed)
This note was copied from the chart of Deanna Hicks. Lactation Consultation Note  Patient Name: Deanna Hicks [id number] Date: 02/04/2015 Reason for consult: Follow-up assessment;Breast/nipple pain;Infant weight loss (7 % weight loss , )  Voiding and stooling adequate for age. Breast feeding consistently , with latch scores 7-8. LC reviewed sore nipples and engorgement prevention and tx. LC assessed breast tissues with moms permission , and noted positional strips on both , areolas compressible LC reviewed basics for latching , prior to latching breast massage, hand express, pre-pump if needed ( 3-5 mins)  Latch with breast compressions until swallows are heard and then intermittent. Also firm support. If soreness doesn't improve in 4 days with using EBM liberally, comfort gels (which mom already has) , instructed  On use shells , and mom has a DEBP ( Medela ). LC reviewed sore nipple and engorgement prevention and tx. Mother informed of post-discharge support and given phone number to the lactation department, including services for phone  call assistance; out-patient appointments; and breastfeeding support group. List of other breastfeeding resources in the community  given in the handout. Encouraged mother to call for problems or concerns related to breastfeeding.   Maternal Data Has patient been taught Hand Expression?: Yes Does the patient have breastfeeding experience prior to this delivery?: No  Feeding Feeding Type:  (baby last fed around 8 am )  LATCH Score/Interventions Latch: Grasps breast easily, tongue down, lips flanged, rhythmical sucking.  Audible Swallowing: Spontaneous and intermittent Intervention(s): Skin to skin  Type of Nipple: Everted at rest and after stimulation  Comfort (Breast/Nipple): Filling, red/small blisters or bruises, mild/mod discomfort  Problem noted: Cracked, bleeding, blisters, bruises;Mild/Moderate discomfort Interventions   (Cracked/bleeding/bruising/blister): Expressed breast milk to nipple Interventions (Mild/moderate discomfort): Comfort gels  Hold (Positioning): Assistance needed to correctly position infant at breast and maintain latch. Intervention(s): Breastfeeding basics reviewed  LATCH Score: 8  Lactation Tools Discussed/Used WIC Program: No Pump Review: Milk Storage   Consult Status Consult Status: Complete Date: 02/04/15    Kathrin Greathouse 02/04/2015, 11:24 AM

## 2015-02-17 ENCOUNTER — Ambulatory Visit (HOSPITAL_COMMUNITY)
Admission: RE | Admit: 2015-02-17 | Discharge: 2015-02-17 | Disposition: A | Discharge status: Home / Self Care | Payer: BLUE CROSS/BLUE SHIELD | Source: Ambulatory Visit | Attending: Obstetrics and Gynecology | Admitting: Obstetrics and Gynecology

## 2015-02-17 NOTE — Lactation Note (Signed)
Lactation Consult  Mother's reason for visit:  Poor weight gain Visit Type:  Feeding assessment, OP Appointment Notes:  Mom reports Deanna Hicks was gaining weight and breastfeeding well till past week. On 02/07/15 Deanna Hicks had low temp 95+ in Peds office and was admitted to Peds at [facility] for observation.  At [facility] were monitored every 4 hours and remained stable, Deanna Hicks was d/c home at 24 hours.  Mom reports Deanna Hicks gained weight during that 24 hour observation period. Mom went to [facility] for f/u yesterday and Deanna Hicks Hicks had only gained 1/2 oz in 6 days. Deanna Hicks has jaundice and bili levels were evaluated while admitted to Peds. Deanna Hicks was seen by Dr. Vonna Kotyk yesterday. Mom reports Deanna Hicks has been cluster feeding the past few nights, sometimes nursing every 30 minutes to 1 hour. Deanna Hicks Hicks is 15 days old.  Consult:  Initial Lactation Consultant:  Alfred Levins  ________________________________________________________________________  Deanna Hicks's Name: Deanna Hicks Hicks Date of Birth: 02/01/2017 Pediatrician: Dr. Vonna Kotyk - [facility] Gender: female Gestational Age: <None> (At Birth) Birth Weight: 7 lb 9 oz (3430 g) Weight at Discharge: Weight: 7 lb 0.4 oz (3185 g)Date of Discharge: 02/04/2015 [facility] Weights   02/01/15 2311 02/02/15 2334 02/03/15 2314  Weight: 7 lb 8.6 oz (3420 g) 7 lb 3 oz (3260 g) 7 lb 0.4 oz (3185 g)   Last weight taken from location outside of [facility]: 02/16/15  7 lb. 2 oz Location:Pediatrician's office Weight today: 7 lb. 4.0 oz/3288 gm     ________________________________________________________________________  Mother's Name: Deanna Hicks Hicks Type of delivery:  C/S Breastfeeding Experience:  P1 Maternal Medical Conditions:  Thyroid - Hypothyroid Maternal Medications:  PNV/DHA supplement - Levothyroxine 100 mcg QD  ________________________________________________________________________  Breastfeeding History (Post  Discharge)  Frequency of breastfeeding:  13-18 times day, cluster feeding at night past few days.  Duration of feeding:  8 to 40 minutes (20 minutes on average). Mom reports Deanna Hicks usually breastfeed on 1 breast per feeding.   Mom is not pumping or supplementing at this time. Mom has Medela PNS DEBP  Infant Intake and Output Assessment  Voids:  5-6 in 24 hrs.  Color:  Clear yellow Stools:  6-7 in 24 hrs.  Color:  Yellow/seedy  ________________________________________________________________________  Maternal Breast Assessment  Breast:  Soft Nipple:  Erect Pain level:  0   _______________________________________________________________________ Feeding Assessment/Evaluation  Initial feeding assessment:  Infant's oral assessment:  Variance. Upper lip labial frenulum. Possible short posterior lingual frenulum, Deanna Hicks does have tongue mobility. With suck exam upper gum ridge rubs across LC finger, lip tucks.   Positioning:  Started in cross cradle but Mom not relaxed and Deanna Hicks not sustaining good depth. Changed to football hold Left breast  LATCH documentation:  Latch:  1 = Repeated attempts needed to sustain latch, nipple held in mouth throughout feeding, stimulation needed to elicit sucking reflex.  Audible swallowing:  2 = Spontaneous and intermittent  Type of nipple:  2 = Everted at rest and after stimulation  Comfort (Breast/Nipple):  2 = Soft / non-tender  Hold (Positioning):  1 = Assistance needed to correctly position infant at breast and maintain latch  LATCH score:  8  Attached assessment:  Deanna Hicks will latch deeply, with nursing will become shallow needing some adjustment to sustain depth.  Lips flanged:  Yes.    Lips untucked:  No. Upper lip will intermittently tuck, needs adjusting  Suck assessment:  Displays both nutritive and non-nutritive suckling pattern. Sleepy at the breast, lots of stimulation  needed to stimulate some suckling bursts  Tools:   Bottle/Pump Instructed on use and cleaning of tool:  Yes.    Pre-feed weight:  3288 g  (7 lb. 4.0 oz.) Post-feed weight:  3320 g (7 lb. 5.1 oz.) Amount transferred:  32 ml with nursing for 20 minutes   Additional Feeding Assessment -   Infant's oral assessment:  Variance  Positioning:  Football Right breast  LATCH documentation:  Latch:  1 = Repeated attempts needed to sustain latch, nipple held in mouth throughout feeding, stimulation needed to elicit sucking reflex.  Audible swallowing:  2 = Spontaneous and intermittent  Type of nipple:  2 = Everted at rest and after stimulation  Comfort (Breast/Nipple):  2 = Soft / non-tender  Hold (Positioning):  1 = Assistance needed to correctly position infant at breast and maintain latch  LATCH score:  8  Attached assessment:  Deep  Lips flanged:  Yes.    Lips untucked:  No.  Upper lip needs adjusting at time  Suck assessment:  Displays both nutritive and non-nutritive suckling pattern  Tools:  Bottle Instructed on use and cleaning of tool:  Yes.    Pre-feed weight:  3320 g  (7 lb. 5.1 oz.) Post-feed weight:  3336 g (7 lb. 5.7 oz.) Amount transferred:  16 ml with nursing for 15 minutes Amount supplemented:  30 ml  Of EBM   Total amount pumped post feed:  R 10 ml    L 21 ml  Set up Mom's Medela DEBP for her to use, demonstrated set up and cleaning of pump parts. Discussed storage guidelines for breastmilk. Advised to refer to Deanna Hicks N Me Booklet page 25.   Total amount transferred:  48 ml Total supplement given:  30 ml  Assisted Mom with positioning and obtaining good depth with latch. Deanna Hicks can obtain good depth with initial latch but will become shallow during the feeding requiring some adjustment. Mom is more comfortable in football hold. Deanna Hicks sleepy at the breast and demonstrated a lot of non-nutritive suckling off/on. Advised Mom this could be due to jaundice but also discussed with Mom how decrease weight gain can affect Deanna Hicks's  energy.  Deanna Hicks does appear to have a short upper lip labial frenulum resulting in Deanna Hicks tucking the upper lip intermittently. Possible posterior lingual frenulum as well. Mom does not have pain with breastfeeding but she does have a compression line when Deanna Hicks comes off the breast. This may also be due to the sleepy Deanna Hicks not sustaining depth throughout the feeding. May need further evaluation later if Deanna Hicks unable to sustain good weight gain and milk transfer at the breast.  LC advised Mom lets work on feeding plan to support Deanna Hicks's weight gain. Plan discussed with Mom: BF 8-12 times in 24 hours and with feeding ques. Try to pre-pump for 3-5 minutes to remove lower fat milk so Deanna Hicks will get higher fat milk at breast. Keep Deanna Hicks nursing for 15-20 minutes, both breasts each feeding. Post pump for 15 minutes to encourage milk production and to have EBM to supplement till Deanna Hicks more efficient at the breast. Start supplements with 20 ml of EBM increasing up to 30 if needed.  Breastfeed only at night. Use Dr. Manson Passey #1 bottle to supplement to control flow of milk.  Peds f/u Tuesday, 02/22/15 OP Lactation f/u Friday, 02/25/15 at 1:00.

## 2015-02-25 ENCOUNTER — Ambulatory Visit (HOSPITAL_COMMUNITY)
Admission: RE | Admit: 2015-02-25 | Discharge: 2015-02-25 | Disposition: A | Discharge status: Home / Self Care | Payer: BLUE CROSS/BLUE SHIELD | Source: Ambulatory Visit | Attending: Obstetrics and Gynecology | Admitting: Obstetrics and Gynecology

## 2015-02-25 NOTE — Lactation Note (Addendum)
Lactation Consult  Mother's reason for visit:  Follow up from weight loss Visit Type:  Feeding assessment  Appointment Notes: Mother saw Dr Vonna Kotykeclaire  on Tuesday and infant had a gain of 6 ounces. Wednesday a gain of 2.8 ounces in 2 days , weight was 7-8.8 . Will see Dr Edward Qualiaeclare on July 12 Consult:  Initial Lactation Consultant:  Michel BickersKendrick, Nicholaos Schippers McCoy  ________________________________________________________________________    ________________________________________________________________________  Mother's Name: Deanna CoupeHeather B Garrette Type of delivery:  C-Section  Breastfeeding Experience:   Maternal Medical Conditions:  Hypothyroid Maternal Medications:  Levthothyroxine 100mcg, prenatal vits  ________________________________________________________________________  Breastfeeding History (Post Discharge)  Frequency of breastfeeding:  Mother states that she last breastfed at 3 am , but none in 24 hours piror . She states that she has been only pumping for 24 hours. Duration of feeding: 25 mins   Breastmilk:  Volume 60 ml Frequency:  Every 2-3 hours   Method:  Bottle,   Pumping  Type of pump:  Medela pump in style Frequency:  8-10 times Volume:  100 ml  Infant Intake and Output Assessment  Voids: -5-6 in 24 hrs.  Color:  Clear yellow Stools: 5-6 in 24 hrs.  Color:  Yellow  ________________________________________________________________________  Maternal Breast Assessment  Breast:  Filling Nipple:  Erect Pain level:  0 Pain interventions:  Bra  _______________________________________________________________________ Feeding Assessment/Evaluation: Mother states she is becoming overwhelmed with breastfeeding, supplementing and post pumping.    Infant latched well but needed frequent breast compression to stimulate strong suckling and swallows  infant transferred 12 ml from first breast.  SNS was sat up with 15 ml of ebm. Infant transferred 3ml from breast and took 15  ml from SNS   Infant's oral assessment:  Variance  Positioning:  Cross cradle Right breast  LATCH documentation:  Latch:  2 = Grasps breast easily, tongue down, lips flanged, rhythmical sucking.  Audible swallowing:  2 = Spontaneous and intermittent  Type of nipple:  2 = Everted at rest and after stimulation  Comfort (Breast/Nipple):  1 = Filling, red/small blisters or bruises, mild/mod discomfort  Hold (Positioning):  1 = Assistance needed to correctly position infant at breast and maintain latch  LATCH score:  8  Attached assessment:  Deep  Lips flanged:  Yes.    Lips untucked:  Yes.    Suck assessment:  Displays both   Pre-feed weight:  3460 g 7-10 Post-feed weight: 3472 g , 7-10.5  Amount transferred 12 ml    Infant's oral assessment:  Variance  Positioning:  Cross cradle Left breast  LATCH documentation:  Latch:  2 = Grasps breast easily, tongue down, lips flanged, rhythmical sucking.  Audible swallowing:  2 = Spontaneous and intermittent  Type of nipple:  2 = Everted at rest and after stimulation  Comfort (Breast/Nipple):  1 = Filling, red/small blisters or bruises, mild/mod discomfort  Hold (Positioning):  1 = Assistance needed to correctly position infant at breast and maintain latch  LATCH score:  8  Attached assessment:  Deep  Lips flanged:  Yes.    Lips untucked:  Yes.    Suck assessment:  Displays both  Tools:  SNS Instructed on use and cleaning of tool:  yes  Pre-feed weight:  3472 Post-feed weight:  3490 Amount transferred: 18 ml Amount supplemented:  15 ml ebm given with SNS and infant transferred additional 3ml from breast.    Total amount transferred:  15 ml Total supplement given:  15 ml Mother was given a plan  to pump for one to two days every 2-3 hours for 20 mins.   and bottle feed if feeling to overwhelmed with both breastfeed and pumping Suggested that she use SNS for supplement if not to complicated for her at least once  daily Advised to give infant 2-3 ounces of ebm every 2-3 hours Advised mother to follow up in one week. She was scheduled an appt on July 6 at 2:30.

## 2015-03-02 ENCOUNTER — Ambulatory Visit (HOSPITAL_COMMUNITY)
Admission: RE | Admit: 2015-03-02 | Discharge: 2015-03-02 | Disposition: A | Discharge status: Home / Self Care | Payer: BLUE CROSS/BLUE SHIELD | Source: Ambulatory Visit | Attending: Obstetrics and Gynecology | Admitting: Obstetrics and Gynecology

## 2015-03-02 NOTE — Lactation Note (Signed)
Lactation Consult  Mother's reason for visit:  Follow up visit for slow weight gain Visit Type: Feeding assessment  Consult:  Follow-Up Lactation Consultant:  Deanna RilesWeeks, Deanna Hicks  ________________________________________________________________________    ________________________________________________________________________  Mother's Name: Deanna CoupeHeather B Hicks Type of delivery:   Breastfeeding Experience:  P1 Maternal Medical Conditions:  hypothyroid Maternal Medications:  Thyroid med  ________________________________________________________________________  Breastfeeding History (Post Discharge)  Frequency of breastfeeding:  Has been mostly pumping and bottle feeding EBM until yesterday when she did put the baby to the breast at least 6 times Duration of feeding:  30 -45 min  Supplementation  Formula:  Volume 0 ml        Breastmilk:  Volume 80 ml Frequency:  q feeding Method:  Bottle,  Has SNS but has not used it at home  Pumping  Type of pump:  Medela pump in style Frequency:  q 2-3 hours Volume:   50 - 120 ml  Infant Intake and Output Assessment  Voids:  6-8 24 hrs.  Color:  Clear yellow Stools:  4in 24 hrs.  Color:  Yellow  ________________________________________________________________________  Maternal Breast Assessment  Breast:  Soft Nipple:  Erect _______________________________________________________________________ Feeding Assessment/Evaluation  Initial feeding assessment:  Infant's oral assessment:  Variance  Positioning:  Cradle Left breast  LATCH documentation:  Latch:  1 = Repeated attempts needed to sustain latch, nipple held in mouth throughout feeding, stimulation needed to elicit sucking reflex.  Audible swallowing:  1 = A few with stimulation  Type of nipple:  2 = Everted at rest and after stimulation  Comfort (Breast/Nipple):  2 = Soft / non-tender  Hold (Positioning):  1 = Assistance needed to correctly position infant at breast and  maintain latch  LATCH score:  7  Attached assessment:  Deep  Lips flanged:  Yes.    Lips untucked:  Yes.    Suck assessment:  Displays both  Pre-feed weight:    3602 g  (7 lb. 15.1 oz.) Post-feed weight:  3634  g (8  lb. 0.2 oz.) Amount transferred:  32 ml Amount supplemented:  0 ml  Additional Feeding Assessment -   Infant's oral assessment:  Variance  Positioning:  Football Right breast  LATCH documentation:  Latch:  1 = Repeated attempts needed to sustain latch, nipple held in mouth throughout feeding, stimulation needed to elicit sucking reflex.  Audible swallowing:  1 = A few with stimulation  Type of nipple:  2 = Everted at rest and after stimulation  Comfort (Breast/Nipple):  2 = Soft / non-tender  Hold (Positioning):  1 = Assistance needed to correctly position infant at breast and maintain latch  LATCH score:  7  Attached assessment:  Deep  Lips flanged:  Yes.    Lips untucked:  Yes.    Suck assessment:  Nonnutritive    Pre-feed weight:  3634 g  (8 lb. 0.2 oz.) Post-feed weight:  3654 g (8 lb. 0.9 oz.) Amount transferred:  20 ml    Total amount pumped post feed:  Mom did not bring her electric pump with her- will pup when she gets home and feed to baby  Total amount transferred:  53 ml  Mom concerned that her milk supply has decreased in the last 2 days. Has been taking Fenugreek since Monday- felt like it helped the first day but not since. Has been mostly pumping and bottle feeding EBM since last appointment  Did more nursing yesterday and baby was very fussy last night like she was  still hungry. Asking about post tongue tie- She has a friend that saw Dr Orland Mustard in [place]. Encouragement given to see him and see what he says. Encouraged to continue pumping to promote milk supply. To see Ped Tuesday July 12. Encouraged to call for another appointment if she sees Dr Orland Mustard. No further questions at present. To call prn

## 2015-12-08 DIAGNOSIS — E039 Hypothyroidism, unspecified: Secondary | ICD-10-CM | POA: Diagnosis not present

## 2016-01-20 DIAGNOSIS — E063 Autoimmune thyroiditis: Secondary | ICD-10-CM | POA: Diagnosis not present

## 2016-01-20 DIAGNOSIS — E039 Hypothyroidism, unspecified: Secondary | ICD-10-CM | POA: Diagnosis not present

## 2016-06-09 DIAGNOSIS — R197 Diarrhea, unspecified: Secondary | ICD-10-CM | POA: Diagnosis not present

## 2016-11-14 DIAGNOSIS — E039 Hypothyroidism, unspecified: Secondary | ICD-10-CM | POA: Diagnosis not present

## 2016-11-21 DIAGNOSIS — R5383 Other fatigue: Secondary | ICD-10-CM | POA: Diagnosis not present

## 2016-11-21 DIAGNOSIS — E063 Autoimmune thyroiditis: Secondary | ICD-10-CM | POA: Diagnosis not present

## 2016-11-21 DIAGNOSIS — E039 Hypothyroidism, unspecified: Secondary | ICD-10-CM | POA: Diagnosis not present

## 2017-01-09 ENCOUNTER — Encounter: Payer: Self-pay | Admitting: Gynecology

## 2017-01-10 DIAGNOSIS — R5383 Other fatigue: Secondary | ICD-10-CM | POA: Diagnosis not present

## 2017-01-10 DIAGNOSIS — Z6827 Body mass index (BMI) 27.0-27.9, adult: Secondary | ICD-10-CM | POA: Diagnosis not present

## 2017-01-10 DIAGNOSIS — E039 Hypothyroidism, unspecified: Secondary | ICD-10-CM | POA: Diagnosis not present

## 2017-01-10 DIAGNOSIS — Z01419 Encounter for gynecological examination (general) (routine) without abnormal findings: Secondary | ICD-10-CM | POA: Diagnosis not present

## 2017-01-15 DIAGNOSIS — E039 Hypothyroidism, unspecified: Secondary | ICD-10-CM | POA: Diagnosis not present

## 2017-01-15 DIAGNOSIS — E538 Deficiency of other specified B group vitamins: Secondary | ICD-10-CM | POA: Diagnosis not present

## 2017-01-15 DIAGNOSIS — E063 Autoimmune thyroiditis: Secondary | ICD-10-CM | POA: Diagnosis not present

## 2017-01-15 DIAGNOSIS — E559 Vitamin D deficiency, unspecified: Secondary | ICD-10-CM | POA: Diagnosis not present

## 2017-04-17 DIAGNOSIS — E538 Deficiency of other specified B group vitamins: Secondary | ICD-10-CM | POA: Diagnosis not present

## 2017-04-17 DIAGNOSIS — E559 Vitamin D deficiency, unspecified: Secondary | ICD-10-CM | POA: Diagnosis not present

## 2017-04-17 DIAGNOSIS — E039 Hypothyroidism, unspecified: Secondary | ICD-10-CM | POA: Diagnosis not present

## 2017-05-03 DIAGNOSIS — Z3201 Encounter for pregnancy test, result positive: Secondary | ICD-10-CM | POA: Diagnosis not present

## 2017-05-05 ENCOUNTER — Encounter (HOSPITAL_COMMUNITY): Payer: Self-pay | Admitting: *Deleted

## 2017-05-05 ENCOUNTER — Inpatient Hospital Stay (HOSPITAL_COMMUNITY)
Admission: AD | Admit: 2017-05-05 | Discharge: 2017-05-05 | Disposition: A | Discharge status: Home / Self Care | Payer: BLUE CROSS/BLUE SHIELD | Source: Ambulatory Visit | Attending: Obstetrics and Gynecology | Admitting: Obstetrics and Gynecology

## 2017-05-05 ENCOUNTER — Inpatient Hospital Stay (HOSPITAL_COMMUNITY): Payer: BLUE CROSS/BLUE SHIELD

## 2017-05-05 DIAGNOSIS — O26899 Other specified pregnancy related conditions, unspecified trimester: Secondary | ICD-10-CM

## 2017-05-05 DIAGNOSIS — Z3A01 Less than 8 weeks gestation of pregnancy: Secondary | ICD-10-CM | POA: Diagnosis not present

## 2017-05-05 DIAGNOSIS — O26891 Other specified pregnancy related conditions, first trimester: Secondary | ICD-10-CM | POA: Diagnosis not present

## 2017-05-05 DIAGNOSIS — M7918 Myalgia, other site: Secondary | ICD-10-CM

## 2017-05-05 DIAGNOSIS — R109 Unspecified abdominal pain: Secondary | ICD-10-CM | POA: Insufficient documentation

## 2017-05-05 DIAGNOSIS — R1031 Right lower quadrant pain: Secondary | ICD-10-CM | POA: Diagnosis not present

## 2017-05-05 DIAGNOSIS — M791 Myalgia: Secondary | ICD-10-CM

## 2017-05-05 HISTORY — DX: Hypothyroidism, unspecified: E03.9

## 2017-05-05 LAB — CBC
HCT: 34.9 % — ABNORMAL LOW (ref 36.0–46.0)
Hemoglobin: 12.1 g/dL (ref 12.0–15.0)
MCH: 30 pg (ref 26.0–34.0)
MCHC: 34.7 g/dL (ref 30.0–36.0)
MCV: 86.6 fL (ref 78.0–100.0)
Platelets: 276 10*3/uL (ref 150–400)
RBC: 4.03 MIL/uL (ref 3.87–5.11)
RDW: 13 % (ref 11.5–15.5)
WBC: 8.4 10*3/uL (ref 4.0–10.5)

## 2017-05-05 LAB — URINALYSIS, ROUTINE W REFLEX MICROSCOPIC
Bilirubin Urine: NEGATIVE
Glucose, UA: NEGATIVE mg/dL
Hgb urine dipstick: NEGATIVE
Ketones, ur: 5 mg/dL — AB
Leukocytes, UA: NEGATIVE
Nitrite: NEGATIVE
Protein, ur: NEGATIVE mg/dL
Specific Gravity, Urine: 1.013 (ref 1.005–1.030)
pH: 6 (ref 5.0–8.0)

## 2017-05-05 LAB — HCG, QUANTITATIVE, PREGNANCY: hCG, Beta Chain, Quant, S: 34688 m[IU]/mL — ABNORMAL HIGH (ref <5)

## 2017-05-05 LAB — POCT PREGNANCY, URINE: Preg Test, Ur: POSITIVE — AB

## 2017-05-05 LAB — ABO/RH: ABO/RH(D): O POS

## 2017-05-05 NOTE — MAU Provider Note (Signed)
History     CSN: [id number]  Arrival date and time: 05/05/17 0321   First Provider Initiated Contact with Patient 05/05/17 [phone]      Chief Complaint  Patient presents with  . Abdominal Pain   HPI   [name] is a 31 y.o. female G2P1 @ 6831w2d here in MAU with right side hip/groin pain. The pain started this evening [place]. The pain was constant and only improved if she laid on her right side. She was unable to get comfortable. She called the midwife and she recommended she come in.  Patient concerned about ectopic. States she got in the shower at home and the pain went away. She denies vaginal bleeding.   OB History    Gravida Para Term Preterm AB Living   2 1       1    SAB TAB Ectopic Multiple Live Births         0 1      Past Medical History:  Diagnosis Date  . Endometriosis   . 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 Hypothyroidism   . Postpartum care following cesarean delivery (6/7) 02/01/2015    Past Surgical History:  Procedure Laterality Date  . CESAREAN SECTION N/A 02/01/2015   Procedure: Primary CESAREAN SECTION;  Surgeon: Olivia Mackieichard Taavon, MD;  Location: [facility];  Service: Obstetrics;  Laterality: N/A;  EDD: 02/02/15. approved by Myra    Family History  Problem Relation Age of Onset  . Diabetes Father   . Heart disease Father        passed away at age 442  . Endometriosis Mother   . Endometriosis Sister     Social History  Substance Use Topics  . Smoking status: Never Smoker  . Smokeless tobacco: Never Used  . Alcohol use Yes     Comment: RARE    Allergies: No Known Allergies  Prescriptions Prior to Admission  Medication Sig Dispense Refill Last Dose  . ARMOUR THYROID PO Take by mouth. 60mg  and 15 mg every morning.   05/04/2017 at Unknown time  . Docosahexaenoic Acid (DHA PO) Take 1 tablet by mouth daily.   Past Week at Unknown time  . Prenatal Multivit-Min-Fe-FA (PRENATAL VITAMINS PO) Take 1 tablet by  mouth 3 (three) times daily.    Past Week at Unknown time  . ibuprofen (ADVIL,MOTRIN) 600 MG tablet Take 1 tablet (600 mg total) by mouth every 6 (six) hours. 30 tablet 0   . levothyroxine (SYNTHROID, LEVOTHROID) 100 MCG tablet Take 100 mcg by mouth daily before breakfast.   02/01/2015 at Unknown time  . oxyCODONE-acetaminophen (PERCOCET/ROXICET) 5-325 MG per tablet Take 1 tablet by mouth every 4 (four) hours as needed (for pain scale 4-7). 30 tablet 0    Results for orders placed or performed during the hospital encounter of 05/05/17 (from the past 48 hour(s))  Urinalysis, Routine w reflex microscopic     Status: Abnormal   Collection Time: 05/05/17  3:35 AM  Result Value Ref Range   Color, Urine YELLOW YELLOW   APPearance CLEAR CLEAR   Specific Gravity, Urine 1.013 1.005 - 1.030   pH 6.0 5.0 - 8.0   Glucose, UA NEGATIVE NEGATIVE mg/dL   Hgb urine dipstick NEGATIVE NEGATIVE   Bilirubin Urine NEGATIVE NEGATIVE   Ketones, ur 5 (A) NEGATIVE mg/dL   Protein, ur NEGATIVE NEGATIVE mg/dL   Nitrite NEGATIVE NEGATIVE   Leukocytes, UA NEGATIVE NEGATIVE  Pregnancy, urine POC  Status: Abnormal   Collection Time: 05/05/17  3:55 AM  Result Value Ref Range   Preg Test, Ur POSITIVE (A) NEGATIVE    Comment:        THE SENSITIVITY OF THIS METHODOLOGY IS >24 mIU/mL   CBC     Status: Abnormal   Collection Time: 05/05/17  4:04 AM  Result Value Ref Range   WBC 8.4 4.0 - 10.5 K/uL   RBC 4.03 3.87 - 5.11 MIL/uL   Hemoglobin 12.1 12.0 - 15.0 g/dL   HCT 16.1 (L) 09.6 - 04.5 %   MCV 86.6 78.0 - 100.0 fL   MCH 30.0 26.0 - 34.0 pg   MCHC 34.7 30.0 - 36.0 g/dL   RDW 40.9 81.1 - 91.4 %   Platelets 276 150 - 400 K/uL  ABO/Rh     Status: None   Collection Time: 05/05/17  4:04 AM  Result Value Ref Range   ABO/RH(D) O POS   hCG, quantitative, pregnancy     Status: Abnormal   Collection Time: 05/05/17  4:04 AM  Result Value Ref Range   hCG, Beta Chain, Quant, S 34,688 (H) <5 mIU/mL    Comment:           GEST. AGE      CONC.  (mIU/mL)   <=1 WEEK        5 - 50     2 WEEKS       50 - 500     3 WEEKS       100 - 10,000     4 WEEKS     1,000 - 30,000     5 WEEKS     3,500 - 115,000   6-8 WEEKS     12,000 - 270,000    12 WEEKS     15,000 - 220,000        FEMALE AND NON-PREGNANT FEMALE:     LESS THAN 5 mIU/mL    US Ob Comp Less 14 Wks  Result Date: 05/05/2017 CLINICAL DATA:  RIGHT lower quadrant pain beginning at 7 p.m. Gestational age by last menstrual period 6 weeks and 2 days. EXAM: OBSTETRIC <14 WK [place] AND TRANSVAGINAL OB US TECHNIQUE: Both transabdominal and transvaginal ultrasound examinations were performed for complete evaluation of the gestation as well as the maternal uterus, adnexal regions, and pelvic cul-de-sac. Transvaginal technique was performed to assess early pregnancy. COMPARISON:  None. FINDINGS: Intrauterine gestational sac: Present Yolk sac:  Present Embryo:  Present Cardiac Activity: Present Heart Rate: 97  bpm CRL:  2  mm   5 w   5 d                  [place] EDC: Dec 31, 2017 Subchorionic hemorrhage:  None visualized. Maternal uterus/adnexae: LEFT ovarian corpus luteal cyst. No free fluid. IMPRESSION: Single live intrauterine pregnancy, gestational age by ultrasound 5 weeks and 5 days without immediate complication. Electronically Signed   By: Awilda Metro M.D.   On: 05/05/2017 04:56   US Ob Transvaginal  Result Date: 05/05/2017 CLINICAL DATA:  RIGHT lower quadrant pain beginning at 7 p.m. Gestational age by last menstrual period 6 weeks and 2 days. EXAM: OBSTETRIC <14 WK [place] AND TRANSVAGINAL OB US TECHNIQUE: Both transabdominal and transvaginal ultrasound examinations were performed for complete evaluation of the gestation as well as the maternal uterus, adnexal regions, and pelvic cul-de-sac. Transvaginal technique was performed to assess early pregnancy. COMPARISON:  None. FINDINGS: Intrauterine gestational sac: Present Yolk sac:  Present Embryo:  Present Cardiac Activity: Present  Heart Rate: 97  bpm CRL:  2  mm   5 w   5 d                  [place] EDC: Dec 31, 2017 Subchorionic hemorrhage:  None visualized. Maternal uterus/adnexae: LEFT ovarian corpus luteal cyst. No free fluid. IMPRESSION: Single live intrauterine pregnancy, gestational age by ultrasound 5 weeks and 5 days without immediate complication. Electronically Signed   By: Awilda Metro M.D.   On: 05/05/2017 04:56   Review of Systems  Constitutional: Negative for fever.  Gastrointestinal: Negative for abdominal pain (None now. ), nausea and vomiting.  Genitourinary: Negative for dysuria.   Physical Exam   Blood pressure 104/69, pulse 80, temperature 98.1 F (36.7 C), resp. rate 16, height  (1.499 m), weight 57.2 kg (126 lb), last menstrual period 03/22/2017, unknown if currently breastfeeding.  Physical Exam  Constitutional: She is oriented to person, place, and time. She appears well-developed and well-nourished. No distress.  HENT:  Head: Normocephalic.  Respiratory: Effort normal.  Musculoskeletal: Normal range of motion.       Right hip: She exhibits normal range of motion, normal strength, no tenderness and no swelling.  Neurological: She is alert and oriented to person, place, and time.  Skin: Skin is warm. She is not diaphoretic.  Psychiatric: Her behavior is normal.    MAU Course  Procedures  None  MDM  UA CBC, Hcg, ABO US OB transvaginal  Discussed with Dr. Billy Coast, ok for discharge.   Assessment and Plan   A:  1. Musculoskeletal pain   2. Abdominal pain in pregnancy, antepartum     P:  Discharge home in stable condition Follow up with Dr. Billy Coast Return to MAU if symptoms worsen Continue prenatal vitamins RICE therapy  Push Oral hydration.   Venia Carbon I, NP 05/05/2017 5:28 AM

## 2017-05-05 NOTE — Discharge Instructions (Signed)
Abdominal Pain During Pregnancy Abdominal pain is common in pregnancy. Most of the time, it does not cause harm. There are many causes of abdominal pain. Some causes are more serious than others and sometimes the cause is not known. Abdominal pain can be a sign that something is very wrong with the pregnancy or the pain may have nothing to do with the pregnancy. Always tell your health care provider if you have any abdominal pain. Follow these instructions at home:  Do not have sex or put anything in your vagina until your symptoms go away completely.  Watch your abdominal pain for any changes.  Get plenty of rest until your pain improves.  Drink enough fluid to keep your urine clear or pale yellow.  Take over-the-counter or prescription medicines only as told by your health care provider.  Keep all follow-up visits as told by your health care provider. This is important. Contact a health care provider if:  You have a fever.  Your pain gets worse or you have cramping.  Your pain continues after resting. Get help right away if:  You are bleeding, leaking fluid, or passing tissue from the vagina.  You have vomiting or diarrhea that does not go away.  You have painful or bloody urination.  You notice a decrease in your baby's movements.  You feel very weak or faint.  You have shortness of breath.  You develop a severe headache with abdominal pain.  You have abnormal vaginal discharge with abdominal pain. This information is not intended to replace advice given to you by your health care provider. Make sure you discuss any questions you have with your health care provider. Document Released: 08/13/2005 Document Revised: 05/24/2016 Document Reviewed: 03/12/2013 Elsevier Interactive Patient Education  2018 [employer]  Musculoskeletal Pain Musculoskeletal pain is muscle and bone aches and pains. This pain can occur in any part of the body. Follow these instructions at  home:  Only take medicines for pain, discomfort, or fever as told by your health care provider.  You may continue all activities unless the activities cause more pain. When the pain lessens, slowly resume normal activities. Gradually increase the intensity and duration of the activities or exercise.  During periods of severe pain, bed rest may be helpful. Lie or sit in any position that is comfortable, but get out of bed and walk around at least every several hours.  If directed, put ice on the injured area. ? Put ice in a plastic bag. ? Place a towel between your skin and the bag. ? Leave the ice on for 20 minutes, 2-3 times a day. Contact a health care provider if:  Your pain is getting worse.  Your pain is not relieved with medicines.  You lose function in the area of the pain if the pain is in your arms, legs, or neck. This information is not intended to replace advice given to you by your health care provider. Make sure you discuss any questions you have with your health care provider. Document Released: 08/13/2005 Document Revised: 01/24/2016 Document Reviewed: 04/17/2013 Elsevier Interactive Patient Education  2017 [employer]

## 2017-05-05 NOTE — MAU Note (Signed)
About 1900 started having sharp pain  RLQ whitle walking around. Came home and rested and could not get comfortable. Awoke at 0300 and still was having lower abd pain. No vag bleeding. Took a shower before coming to hosp and pain improved

## 2017-05-08 DIAGNOSIS — E039 Hypothyroidism, unspecified: Secondary | ICD-10-CM | POA: Diagnosis not present

## 2017-05-21 DIAGNOSIS — Z3201 Encounter for pregnancy test, result positive: Secondary | ICD-10-CM | POA: Diagnosis not present

## 2017-06-05 DIAGNOSIS — E039 Hypothyroidism, unspecified: Secondary | ICD-10-CM | POA: Diagnosis not present

## 2017-06-05 DIAGNOSIS — Z3689 Encounter for other specified antenatal screening: Secondary | ICD-10-CM | POA: Diagnosis not present

## 2017-06-05 DIAGNOSIS — Z3481 Encounter for supervision of other normal pregnancy, first trimester: Secondary | ICD-10-CM | POA: Diagnosis not present

## 2017-06-05 LAB — OB RESULTS CONSOLE HEPATITIS B SURFACE ANTIGEN: Hepatitis B Surface Ag: NEGATIVE

## 2017-06-05 LAB — OB RESULTS CONSOLE RUBELLA ANTIBODY, IGM: Rubella: IMMUNE

## 2017-06-05 LAB — OB RESULTS CONSOLE GC/CHLAMYDIA
Chlamydia: NEGATIVE
Gonorrhea: NEGATIVE

## 2017-06-05 LAB — OB RESULTS CONSOLE ABO/RH: RH Type: POSITIVE

## 2017-06-05 LAB — OB RESULTS CONSOLE HIV ANTIBODY (ROUTINE TESTING): HIV: NONREACTIVE

## 2017-06-05 LAB — OB RESULTS CONSOLE RPR: RPR: NONREACTIVE

## 2017-06-05 LAB — OB RESULTS CONSOLE ANTIBODY SCREEN: Antibody Screen: NEGATIVE

## 2017-06-06 DIAGNOSIS — Z3481 Encounter for supervision of other normal pregnancy, first trimester: Secondary | ICD-10-CM | POA: Diagnosis not present

## 2017-06-06 DIAGNOSIS — Z3689 Encounter for other specified antenatal screening: Secondary | ICD-10-CM | POA: Diagnosis not present

## 2017-06-06 DIAGNOSIS — O99281 Endocrine, nutritional and metabolic diseases complicating pregnancy, first trimester: Secondary | ICD-10-CM | POA: Diagnosis not present

## 2017-06-06 DIAGNOSIS — Z3A10 10 weeks gestation of pregnancy: Secondary | ICD-10-CM | POA: Diagnosis not present

## 2017-06-06 DIAGNOSIS — O3680X0 Pregnancy with inconclusive fetal viability, not applicable or unspecified: Secondary | ICD-10-CM | POA: Diagnosis not present

## 2017-06-17 DIAGNOSIS — Z3481 Encounter for supervision of other normal pregnancy, first trimester: Secondary | ICD-10-CM | POA: Diagnosis not present

## 2017-06-17 DIAGNOSIS — Z3682 Encounter for antenatal screening for nuchal translucency: Secondary | ICD-10-CM | POA: Diagnosis not present

## 2017-06-25 DIAGNOSIS — O26899 Other specified pregnancy related conditions, unspecified trimester: Secondary | ICD-10-CM | POA: Diagnosis not present

## 2017-06-25 DIAGNOSIS — Z3A13 13 weeks gestation of pregnancy: Secondary | ICD-10-CM | POA: Diagnosis not present

## 2017-06-25 DIAGNOSIS — R1031 Right lower quadrant pain: Secondary | ICD-10-CM | POA: Diagnosis not present

## 2017-06-25 DIAGNOSIS — O26891 Other specified pregnancy related conditions, first trimester: Secondary | ICD-10-CM | POA: Diagnosis not present

## 2017-06-27 ENCOUNTER — Other Ambulatory Visit: Payer: Self-pay | Admitting: Obstetrics and Gynecology

## 2017-06-27 DIAGNOSIS — R1031 Right lower quadrant pain: Secondary | ICD-10-CM

## 2017-06-28 ENCOUNTER — Ambulatory Visit
Admission: RE | Admit: 2017-06-28 | Discharge: 2017-06-28 | Disposition: A | Discharge status: Home / Self Care | Payer: BLUE CROSS/BLUE SHIELD | Source: Ambulatory Visit | Attending: Obstetrics and Gynecology | Admitting: Obstetrics and Gynecology

## 2017-06-28 DIAGNOSIS — R1031 Right lower quadrant pain: Secondary | ICD-10-CM

## 2017-06-28 DIAGNOSIS — R3121 Asymptomatic microscopic hematuria: Secondary | ICD-10-CM | POA: Diagnosis not present

## 2017-07-15 DIAGNOSIS — O99282 Endocrine, nutritional and metabolic diseases complicating pregnancy, second trimester: Secondary | ICD-10-CM | POA: Diagnosis not present

## 2017-07-15 DIAGNOSIS — Z361 Encounter for antenatal screening for raised alphafetoprotein level: Secondary | ICD-10-CM | POA: Diagnosis not present

## 2017-07-15 DIAGNOSIS — Z3A16 16 weeks gestation of pregnancy: Secondary | ICD-10-CM | POA: Diagnosis not present

## 2017-07-15 DIAGNOSIS — Z3482 Encounter for supervision of other normal pregnancy, second trimester: Secondary | ICD-10-CM | POA: Diagnosis not present

## 2017-07-30 DIAGNOSIS — Z3A18 18 weeks gestation of pregnancy: Secondary | ICD-10-CM | POA: Diagnosis not present

## 2017-07-30 DIAGNOSIS — O99282 Endocrine, nutritional and metabolic diseases complicating pregnancy, second trimester: Secondary | ICD-10-CM | POA: Diagnosis not present

## 2017-07-30 DIAGNOSIS — Z363 Encounter for antenatal screening for malformations: Secondary | ICD-10-CM | POA: Diagnosis not present

## 2017-08-01 DIAGNOSIS — L308 Other specified dermatitis: Secondary | ICD-10-CM | POA: Diagnosis not present

## 2017-08-01 DIAGNOSIS — D224 Melanocytic nevi of scalp and neck: Secondary | ICD-10-CM | POA: Diagnosis not present

## 2017-08-01 DIAGNOSIS — D1801 Hemangioma of skin and subcutaneous tissue: Secondary | ICD-10-CM | POA: Diagnosis not present

## 2017-08-19 ENCOUNTER — Encounter (HOSPITAL_COMMUNITY): Payer: Self-pay | Admitting: *Deleted

## 2017-08-19 ENCOUNTER — Inpatient Hospital Stay (HOSPITAL_COMMUNITY)
Admission: AD | Admit: 2017-08-19 | Discharge: 2017-08-19 | Disposition: A | Discharge status: Home / Self Care | Payer: BLUE CROSS/BLUE SHIELD | Source: Ambulatory Visit | Attending: Obstetrics & Gynecology | Admitting: Obstetrics & Gynecology

## 2017-08-19 DIAGNOSIS — O99891 Other specified diseases and conditions complicating pregnancy: Secondary | ICD-10-CM

## 2017-08-19 DIAGNOSIS — O26892 Other specified pregnancy related conditions, second trimester: Secondary | ICD-10-CM | POA: Diagnosis not present

## 2017-08-19 DIAGNOSIS — W19XXXA Unspecified fall, initial encounter: Secondary | ICD-10-CM | POA: Diagnosis not present

## 2017-08-19 DIAGNOSIS — Z3A21 21 weeks gestation of pregnancy: Secondary | ICD-10-CM | POA: Diagnosis not present

## 2017-08-19 DIAGNOSIS — E039 Hypothyroidism, unspecified: Secondary | ICD-10-CM | POA: Insufficient documentation

## 2017-08-19 DIAGNOSIS — O99282 Endocrine, nutritional and metabolic diseases complicating pregnancy, second trimester: Secondary | ICD-10-CM | POA: Insufficient documentation

## 2017-08-19 DIAGNOSIS — R109 Unspecified abdominal pain: Secondary | ICD-10-CM

## 2017-08-19 DIAGNOSIS — M549 Dorsalgia, unspecified: Secondary | ICD-10-CM

## 2017-08-19 DIAGNOSIS — O9989 Other specified diseases and conditions complicating pregnancy, childbirth and the puerperium: Secondary | ICD-10-CM

## 2017-08-19 NOTE — MAU Provider Note (Signed)
History     CSN: [id number]  Arrival date and time: 08/19/17 2036   First Provider [phone] Contact with Patient 08/19/17 2104     Chief Complaint  Patient presents with  . Fall   HPI Deanna Hicks is a 31 y.o. G3P0011 at 2797w3d who presents after a fall at home. She states she was standing on her couch reaching for her curtains and did a split, landing on her bottom on the couch. She states since then she has had some soreness and aching in her back and lower abdomen. She rates the pain a 3/10 and has not taken anything for the pain. She denies any leaking or vaginal bleeding. She reports good fetal movement.   OB History    Gravida Para Term Preterm AB Living   3 1     1 1    SAB TAB Ectopic Multiple Live Births   1     0 1      Past Medical History:  Diagnosis Date  . Endometriosis   . 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 Hypothyroidism   . Postpartum care following cesarean delivery (6/7) 02/01/2015    Past Surgical History:  Procedure Laterality Date  . CESAREAN SECTION N/A 02/01/2015   Procedure: Primary CESAREAN SECTION;  Surgeon: Olivia Mackieichard Taavon, MD;  Location: [facility];  Service: Obstetrics;  Laterality: N/A;  EDD: 02/02/15. approved by Myra    Family History  Problem Relation Age of Onset  . Diabetes Father   . Heart disease Father        passed away at age 33  . Endometriosis Mother   . Endometriosis Sister     Social History   Tobacco Use  . Smoking status: Never Smoker  . Smokeless tobacco: Never Used  Substance Use Topics  . Alcohol use: Yes    Comment: RARE  . Drug use: No    Allergies: No Known Allergies  Medications Prior to Admission  Medication Sig Dispense Refill Last Dose  . ARMOUR THYROID PO Take by mouth. 60mg  and 15 mg every morning.   08/19/2017 at Unknown time  . Docosahexaenoic Acid (DHA PO) Take 1 tablet by mouth daily.   08/18/2017 at Unknown time  . Prenatal Multivit-Min-Fe-FA (PRENATAL VITAMINS PO) Take 1 tablet by  mouth 3 (three) times daily.    08/18/2017 at Unknown time  . levothyroxine (SYNTHROID, LEVOTHROID) 100 MCG tablet Take 100 mcg by mouth daily before breakfast.   02/01/2015 at Unknown time    Review of Systems  Constitutional: Negative.  Negative for fatigue and fever.  HENT: Negative.   Respiratory: Negative.  Negative for shortness of breath.   Cardiovascular: Negative.  Negative for chest pain.  Gastrointestinal: Positive for abdominal pain. Negative for constipation, diarrhea, nausea and vomiting.  Genitourinary: Negative.  Negative for dysuria.  Musculoskeletal: Positive for back pain.  Neurological: Negative.  Negative for dizziness and headaches.   Physical Exam   Blood pressure 114/72, pulse (!) 107, temperature 97.6 F (36.4 C), temperature source Oral, resp. rate 19, height 4\' 11"  (1.499 m), weight 151 lb (68.5 kg), last menstrual period 03/22/2017, SpO2 98 %, unknown if currently breastfeeding.  Physical Exam  Nursing note and vitals reviewed. Constitutional: She is oriented to person, place, and time. She appears well-developed and well-nourished. No distress.  HENT:  Head: Normocephalic.  Eyes: Pupils are equal, round, and reactive to light.  Cardiovascular: Normal rate, regular rhythm and normal heart sounds.  Respiratory: Effort normal and  breath sounds normal. No respiratory distress.  GI: Soft. Bowel sounds are normal. She exhibits no distension. There is no tenderness.  Neurological: She is alert and oriented to person, place, and time.  Skin: Skin is warm and dry.  Psychiatric: She has a normal mood and affect. Her behavior is normal. Judgment and thought content normal.   Dilation: Closed Effacement (%): Thick Cervical Position: Posterior Exam by:: Ma Hillock. Humphrey Guerreiro CNM  FHT: 154 bpm  MAU Course  Procedures  MDM Cervix closed, no signs or symptoms of trauma at this time Patient declines tylenol while in MAU- states she does not like taking medication Reviewed  with Dr. Juliene PinaMody- ok to discharge patient home with precautions after a fall.  Assessment and Plan   1. Fall, initial encounter   2. Abdominal pain during pregnancy in second trimester   3. Back pain in pregnancy    -Discharge home in stable condition -Encouraged patient to use tylenol and warm compresses for pain management -Vaginal bleeding and pain precautions discussed -Patient advised to follow-up with [facility] as scheduled for prenatal care -Patient may return to MAU as needed or if her condition were to change or worsen  Rolm BookbinderCaroline M Collins Dimaria CNM 08/19/2017, 9:10 PM

## 2017-08-19 NOTE — Discharge Instructions (Signed)

## 2017-08-19 NOTE — MAU Note (Signed)
Pt states she was standing on couch to close curtains and stepped down. When she stepped down she stepped on her daughters toy and did a split, landed on buttocks on the couch. Having some lower abdominal pain. Has not taken anything for pain. Pt denies vaginal bleeding or LOF.

## 2017-08-19 NOTE — MAU Note (Signed)
Urine sent to lab 

## 2017-08-28 DIAGNOSIS — O99282 Endocrine, nutritional and metabolic diseases complicating pregnancy, second trimester: Secondary | ICD-10-CM | POA: Diagnosis not present

## 2017-08-28 DIAGNOSIS — Z3A22 22 weeks gestation of pregnancy: Secondary | ICD-10-CM | POA: Diagnosis not present

## 2017-09-25 DIAGNOSIS — Z3689 Encounter for other specified antenatal screening: Secondary | ICD-10-CM | POA: Diagnosis not present

## 2017-09-25 DIAGNOSIS — O99282 Endocrine, nutritional and metabolic diseases complicating pregnancy, second trimester: Secondary | ICD-10-CM | POA: Diagnosis not present

## 2017-09-25 DIAGNOSIS — Z3A26 26 weeks gestation of pregnancy: Secondary | ICD-10-CM | POA: Diagnosis not present

## 2017-10-03 DIAGNOSIS — O99810 Abnormal glucose complicating pregnancy: Secondary | ICD-10-CM | POA: Diagnosis not present

## 2017-10-03 DIAGNOSIS — Z3A26 26 weeks gestation of pregnancy: Secondary | ICD-10-CM | POA: Diagnosis not present

## 2017-10-09 DIAGNOSIS — Z3A28 28 weeks gestation of pregnancy: Secondary | ICD-10-CM | POA: Diagnosis not present

## 2017-10-09 DIAGNOSIS — Z3689 Encounter for other specified antenatal screening: Secondary | ICD-10-CM | POA: Diagnosis not present

## 2017-10-09 DIAGNOSIS — O99810 Abnormal glucose complicating pregnancy: Secondary | ICD-10-CM | POA: Diagnosis not present

## 2017-10-11 DIAGNOSIS — Z23 Encounter for immunization: Secondary | ICD-10-CM | POA: Diagnosis not present

## 2017-10-16 ENCOUNTER — Encounter: Payer: BLUE CROSS/BLUE SHIELD | Attending: Obstetrics and Gynecology | Admitting: Registered"

## 2017-10-16 DIAGNOSIS — O99810 Abnormal glucose complicating pregnancy: Secondary | ICD-10-CM | POA: Diagnosis not present

## 2017-10-16 DIAGNOSIS — Z713 Dietary counseling and surveillance: Secondary | ICD-10-CM | POA: Diagnosis not present

## 2017-10-16 DIAGNOSIS — R7309 Other abnormal glucose: Secondary | ICD-10-CM

## 2017-10-17 ENCOUNTER — Encounter: Payer: Self-pay | Admitting: Registered"

## 2017-10-17 DIAGNOSIS — R7309 Other abnormal glucose: Secondary | ICD-10-CM | POA: Insufficient documentation

## 2017-10-17 NOTE — Progress Notes (Signed)
Patient was seen on 10/16/2017 for Gestational Diabetes self-management class at the [facility]. The following learning objectives were met by the patient during this course:   States the definition of Gestational Diabetes  States why dietary management is important in controlling blood glucose  Describes the effects each nutrient has on blood glucose levels  Demonstrates ability to create a balanced meal plan  Demonstrates carbohydrate counting   States when to check blood glucose levels  Demonstrates proper blood glucose monitoring techniques  States the effect of stress and exercise on blood glucose levels  States the importance of limiting caffeine and abstaining from alcohol and smoking  Blood glucose monitor given: none  Patient instructed to monitor glucose levels: FBS: 60 - <95; 1 hour: <140; 2 hour: <120  Patient received handouts:  Nutrition Diabetes and Pregnancy, including carb counting list  Patient will be seen for follow-up as needed.

## 2017-10-24 DIAGNOSIS — O24419 Gestational diabetes mellitus in pregnancy, unspecified control: Secondary | ICD-10-CM | POA: Diagnosis not present

## 2017-10-24 DIAGNOSIS — Z3A30 30 weeks gestation of pregnancy: Secondary | ICD-10-CM | POA: Diagnosis not present

## 2017-11-05 DIAGNOSIS — O24419 Gestational diabetes mellitus in pregnancy, unspecified control: Secondary | ICD-10-CM | POA: Diagnosis not present

## 2017-11-05 DIAGNOSIS — Z3A32 32 weeks gestation of pregnancy: Secondary | ICD-10-CM | POA: Diagnosis not present

## 2017-11-18 DIAGNOSIS — O24419 Gestational diabetes mellitus in pregnancy, unspecified control: Secondary | ICD-10-CM | POA: Diagnosis not present

## 2017-11-18 DIAGNOSIS — Z3A34 34 weeks gestation of pregnancy: Secondary | ICD-10-CM | POA: Diagnosis not present

## 2017-11-18 DIAGNOSIS — O99283 Endocrine, nutritional and metabolic diseases complicating pregnancy, third trimester: Secondary | ICD-10-CM | POA: Diagnosis not present

## 2017-11-25 DIAGNOSIS — Z3A35 35 weeks gestation of pregnancy: Secondary | ICD-10-CM | POA: Diagnosis not present

## 2017-11-25 DIAGNOSIS — O24419 Gestational diabetes mellitus in pregnancy, unspecified control: Secondary | ICD-10-CM | POA: Diagnosis not present

## 2017-11-25 DIAGNOSIS — Z3685 Encounter for antenatal screening for Streptococcus B: Secondary | ICD-10-CM | POA: Diagnosis not present

## 2017-11-25 LAB — OB RESULTS CONSOLE GBS: GBS: NEGATIVE

## 2017-11-28 ENCOUNTER — Other Ambulatory Visit: Payer: Self-pay | Admitting: Obstetrics and Gynecology

## 2017-12-04 DIAGNOSIS — Z3A36 36 weeks gestation of pregnancy: Secondary | ICD-10-CM | POA: Diagnosis not present

## 2017-12-04 DIAGNOSIS — O24419 Gestational diabetes mellitus in pregnancy, unspecified control: Secondary | ICD-10-CM | POA: Diagnosis not present

## 2017-12-05 ENCOUNTER — Encounter (HOSPITAL_COMMUNITY): Payer: Self-pay

## 2017-12-06 ENCOUNTER — Encounter (HOSPITAL_COMMUNITY): Payer: Self-pay

## 2017-12-11 DIAGNOSIS — O24419 Gestational diabetes mellitus in pregnancy, unspecified control: Secondary | ICD-10-CM | POA: Diagnosis not present

## 2017-12-11 DIAGNOSIS — Z3A37 37 weeks gestation of pregnancy: Secondary | ICD-10-CM | POA: Diagnosis not present

## 2017-12-17 DIAGNOSIS — Z3A38 38 weeks gestation of pregnancy: Secondary | ICD-10-CM | POA: Diagnosis not present

## 2017-12-17 DIAGNOSIS — O24419 Gestational diabetes mellitus in pregnancy, unspecified control: Secondary | ICD-10-CM | POA: Diagnosis not present

## 2017-12-19 NOTE — Patient Instructions (Signed)
Deanna CoupeHeather B Apps  12/19/2017   Your procedure is scheduled on:  12/21/2017  Enter through the [facility] Entr[facility] of [facility] at 0945 AM.  Pick up the phone at the desk and dial [phone]  Call this number if you have problems the morning of surgery:[phone]  Remember:   Do not eat food:(After Midnight) Desps de medianoche.  Do not drink clear liquids: (After Midnight) Desps de medianoche.  Take these medicines the morning of surgery with A SIP OF WATER: take your thyroid medication as prescribed   Do not wear jewelry, make-up or nail polish.  Do not wear lotions, powders, or perfumes. Do not wear deodorant.  Do not shave 48 hours prior to surgery.  Do not bring valuables to the hospital.  [facility] is not   responsible for any belongings or valuables brought to the hospital.  Contacts, dentures or bridgework may not be worn into surgery.  Leave suitcase in the car. After surgery it may be brought to your room.  For patients admitted to the hospital, checkout time is 11:00 AM the day of              discharge.    N/A   Please read over the following fact sheets that you were given:   Surgical Site Infection Prevention

## 2017-12-20 ENCOUNTER — Encounter (HOSPITAL_COMMUNITY)
Admission: RE | Admit: 2017-12-20 | Discharge: 2017-12-20 | Disposition: A | Discharge status: Home / Self Care | Payer: BLUE CROSS/BLUE SHIELD | Source: Ambulatory Visit | Attending: Obstetrics and Gynecology | Admitting: Obstetrics and Gynecology

## 2017-12-20 DIAGNOSIS — O34219 Maternal care for unspecified type scar from previous cesarean delivery: Secondary | ICD-10-CM | POA: Diagnosis not present

## 2017-12-20 DIAGNOSIS — O34211 Maternal care for low transverse scar from previous cesarean delivery: Secondary | ICD-10-CM | POA: Diagnosis not present

## 2017-12-20 DIAGNOSIS — J069 Acute upper respiratory infection, unspecified: Secondary | ICD-10-CM | POA: Diagnosis not present

## 2017-12-20 DIAGNOSIS — O24420 Gestational diabetes mellitus in childbirth, diet controlled: Secondary | ICD-10-CM | POA: Diagnosis not present

## 2017-12-20 DIAGNOSIS — O24429 Gestational diabetes mellitus in childbirth, unspecified control: Secondary | ICD-10-CM | POA: Diagnosis not present

## 2017-12-20 DIAGNOSIS — O99284 Endocrine, nutritional and metabolic diseases complicating childbirth: Secondary | ICD-10-CM | POA: Diagnosis not present

## 2017-12-20 DIAGNOSIS — O9952 Diseases of the respiratory system complicating childbirth: Secondary | ICD-10-CM | POA: Diagnosis not present

## 2017-12-20 DIAGNOSIS — Z3A39 39 weeks gestation of pregnancy: Secondary | ICD-10-CM | POA: Diagnosis not present

## 2017-12-20 DIAGNOSIS — Z2882 Immunization not carried out because of caregiver refusal: Secondary | ICD-10-CM | POA: Diagnosis not present

## 2017-12-20 DIAGNOSIS — E039 Hypothyroidism, unspecified: Secondary | ICD-10-CM | POA: Diagnosis not present

## 2017-12-20 HISTORY — DX: Gestational diabetes mellitus in pregnancy, unspecified control: O24.419

## 2017-12-20 LAB — TYPE AND SCREEN
ABO/RH(D): O POS
Antibody Screen: NEGATIVE

## 2017-12-20 LAB — CBC
HCT: 40.2 % (ref 36.0–46.0)
Hemoglobin: 14.4 g/dL (ref 12.0–15.0)
MCH: 32.3 pg (ref 26.0–34.0)
MCHC: 35.8 g/dL (ref 30.0–36.0)
MCV: 90.1 fL (ref 78.0–100.0)
Platelets: 240 10*3/uL (ref 150–400)
RBC: 4.46 MIL/uL (ref 3.87–5.11)
RDW: 13.7 % (ref 11.5–15.5)
WBC: 8 10*3/uL (ref 4.0–10.5)

## 2017-12-21 ENCOUNTER — Inpatient Hospital Stay (HOSPITAL_COMMUNITY): Payer: BLUE CROSS/BLUE SHIELD | Admitting: Certified Registered Nurse Anesthetist

## 2017-12-21 ENCOUNTER — Inpatient Hospital Stay (HOSPITAL_COMMUNITY)
Admission: RE | Admit: 2017-12-21 | Discharge: 2017-12-24 | DRG: 788 | Disposition: A | Discharge status: Home / Self Care | Payer: BLUE CROSS/BLUE SHIELD | Source: Ambulatory Visit | Attending: Obstetrics and Gynecology | Admitting: Obstetrics and Gynecology

## 2017-12-21 ENCOUNTER — Encounter (HOSPITAL_COMMUNITY)
Admission: RE | Disposition: A | Discharge status: Home / Self Care | Payer: Self-pay | Source: Ambulatory Visit | Attending: Obstetrics and Gynecology

## 2017-12-21 ENCOUNTER — Encounter (HOSPITAL_COMMUNITY): Payer: Self-pay | Admitting: Emergency Medicine

## 2017-12-21 ENCOUNTER — Other Ambulatory Visit: Payer: Self-pay

## 2017-12-21 DIAGNOSIS — O99284 Endocrine, nutritional and metabolic diseases complicating childbirth: Secondary | ICD-10-CM | POA: Diagnosis present

## 2017-12-21 DIAGNOSIS — O34211 Maternal care for low transverse scar from previous cesarean delivery: Principal | ICD-10-CM | POA: Diagnosis present

## 2017-12-21 DIAGNOSIS — O24420 Gestational diabetes mellitus in childbirth, diet controlled: Secondary | ICD-10-CM | POA: Diagnosis not present

## 2017-12-21 DIAGNOSIS — Z3A39 39 weeks gestation of pregnancy: Secondary | ICD-10-CM

## 2017-12-21 DIAGNOSIS — O34219 Maternal care for unspecified type scar from previous cesarean delivery: Secondary | ICD-10-CM | POA: Diagnosis present

## 2017-12-21 DIAGNOSIS — O24429 Gestational diabetes mellitus in childbirth, unspecified control: Secondary | ICD-10-CM | POA: Diagnosis not present

## 2017-12-21 DIAGNOSIS — E039 Hypothyroidism, unspecified: Secondary | ICD-10-CM | POA: Diagnosis present

## 2017-12-21 DIAGNOSIS — J069 Acute upper respiratory infection, unspecified: Secondary | ICD-10-CM | POA: Diagnosis present

## 2017-12-21 DIAGNOSIS — O9952 Diseases of the respiratory system complicating childbirth: Secondary | ICD-10-CM | POA: Diagnosis present

## 2017-12-21 LAB — GLUCOSE, CAPILLARY: Glucose-Capillary: 77 mg/dL (ref 65–99)

## 2017-12-21 LAB — RPR: RPR Ser Ql: NONREACTIVE

## 2017-12-21 SURGERY — Surgical Case
Anesthesia: Spinal

## 2017-12-21 MED ORDER — PHENYLEPHRINE 8 MG IN D5W 100 ML (0.08MG/ML) PREMIX OPTIME
INJECTION | INTRAVENOUS | Status: DC | PRN
  Administered 2017-12-21: 60 ug/min via INTRAVENOUS

## 2017-12-21 MED ORDER — SIMETHICONE 80 MG PO CHEW: 80.0000 mg | CHEWABLE_TABLET | ORAL | Status: DC | PRN

## 2017-12-21 MED ORDER — LACTATED RINGERS IV SOLN
INTRAVENOUS | Status: DC | PRN
  Administered 2017-12-21: 12:00:00 via INTRAVENOUS

## 2017-12-21 MED ORDER — SENNOSIDES-DOCUSATE SODIUM 8.6-50 MG PO TABS
2.0000 | ORAL_TABLET | ORAL | Status: DC
  Administered 2017-12-22 – 2017-12-23 (×3): 2 via ORAL
  Filled 2017-12-21 (×3): qty 2

## 2017-12-21 MED ORDER — OXYTOCIN 10 UNIT/ML IJ SOLN
INTRAVENOUS | Status: DC | PRN
  Administered 2017-12-21: 40 [IU] via INTRAVENOUS

## 2017-12-21 MED ORDER — MENTHOL 3 MG MT LOZG
1.0000 | LOZENGE | OROMUCOSAL | Status: DC | PRN
  Administered 2017-12-22: 3 mg via ORAL
  Filled 2017-12-21: qty 9

## 2017-12-21 MED ORDER — ONDANSETRON HCL 4 MG/2ML IJ SOLN
INTRAMUSCULAR | Status: DC | PRN
  Administered 2017-12-21: 4 mg via INTRAVENOUS

## 2017-12-21 MED ORDER — ZOLPIDEM TARTRATE 5 MG PO TABS: 5.0000 mg | ORAL_TABLET | Freq: Every evening | ORAL | Status: DC | PRN

## 2017-12-21 MED ORDER — MORPHINE SULFATE (PF) 0.5 MG/ML IJ SOLN
INTRAMUSCULAR | Status: DC | PRN
  Administered 2017-12-21: .2 mg via INTRATHECAL

## 2017-12-21 MED ORDER — ACETAMINOPHEN 325 MG PO TABS
650.0000 mg | ORAL_TABLET | ORAL | Status: DC | PRN
  Administered 2017-12-21: 650 mg via ORAL
  Filled 2017-12-21 (×2): qty 2

## 2017-12-21 MED ORDER — SIMETHICONE 80 MG PO CHEW
80.0000 mg | CHEWABLE_TABLET | ORAL | Status: DC
  Administered 2017-12-22 – 2017-12-23 (×3): 80 mg via ORAL
  Filled 2017-12-21 (×3): qty 1

## 2017-12-21 MED ORDER — OXYTOCIN 10 UNIT/ML IJ SOLN
INTRAMUSCULAR | Status: AC
  Filled 2017-12-21: qty 4

## 2017-12-21 MED ORDER — DIPHENHYDRAMINE HCL 25 MG PO CAPS: 25.0000 mg | ORAL_CAPSULE | Freq: Four times a day (QID) | ORAL | Status: DC | PRN

## 2017-12-21 MED ORDER — MEPERIDINE HCL 25 MG/ML IJ SOLN: 6.2500 mg | INTRAMUSCULAR | Status: DC | PRN

## 2017-12-21 MED ORDER — NALBUPHINE HCL 10 MG/ML IJ SOLN: 5.0000 mg | INTRAMUSCULAR | Status: DC | PRN

## 2017-12-21 MED ORDER — FENTANYL CITRATE (PF) 100 MCG/2ML IJ SOLN
INTRAMUSCULAR | Status: DC | PRN
  Administered 2017-12-21: 10 ug via INTRATHECAL

## 2017-12-21 MED ORDER — LACTATED RINGERS IV BOLUS
500.0000 mL | Freq: Once | INTRAVENOUS | Status: AC
  Administered 2017-12-21: 500 mL via INTRAVENOUS

## 2017-12-21 MED ORDER — SOD CITRATE-CITRIC ACID 500-334 MG/5ML PO SOLN
30.0000 mL | Freq: Once | ORAL | Status: AC
  Administered 2017-12-21: 30 mL via ORAL
  Filled 2017-12-21: qty 15

## 2017-12-21 MED ORDER — LACTATED RINGERS IV SOLN
INTRAVENOUS | Status: DC
  Administered 2017-12-21 – 2017-12-22 (×2): via INTRAVENOUS

## 2017-12-21 MED ORDER — OXYTOCIN 40 UNITS IN LACTATED RINGERS INFUSION - SIMPLE MED: 2.5000 [IU]/h | INTRAVENOUS | Status: AC

## 2017-12-21 MED ORDER — NALOXONE HCL 0.4 MG/ML IJ SOLN: 0.4000 mg | INTRAMUSCULAR | Status: DC | PRN

## 2017-12-21 MED ORDER — NALBUPHINE HCL 10 MG/ML IJ SOLN: 5.0000 mg | Freq: Once | INTRAMUSCULAR | Status: DC | PRN

## 2017-12-21 MED ORDER — BUPIVACAINE HCL (PF) 0.25 % IJ SOLN
INTRAMUSCULAR | Status: DC | PRN
  Administered 2017-12-21: 20 mL

## 2017-12-21 MED ORDER — OXYCODONE-ACETAMINOPHEN 5-325 MG PO TABS
1.0000 | ORAL_TABLET | ORAL | Status: DC | PRN
  Administered 2017-12-22 (×2): 1 via ORAL
  Filled 2017-12-21 (×2): qty 1

## 2017-12-21 MED ORDER — HYDROMORPHONE HCL 1 MG/ML IJ SOLN: 0.2500 mg | INTRAMUSCULAR | Status: DC | PRN

## 2017-12-21 MED ORDER — ONDANSETRON HCL 4 MG/2ML IJ SOLN: 4.0000 mg | Freq: Three times a day (TID) | INTRAMUSCULAR | Status: DC | PRN

## 2017-12-21 MED ORDER — KETOROLAC TROMETHAMINE 30 MG/ML IJ SOLN
30.0000 mg | Freq: Once | INTRAMUSCULAR | Status: AC | PRN
  Administered 2017-12-21: 30 mg via INTRAVENOUS

## 2017-12-21 MED ORDER — PHENYLEPHRINE 8 MG IN D5W 100 ML (0.08MG/ML) PREMIX OPTIME
INJECTION | INTRAVENOUS | Status: AC
  Filled 2017-12-21: qty 100

## 2017-12-21 MED ORDER — OXYCODONE HCL 5 MG/5ML PO SOLN: 5.0000 mg | Freq: Once | ORAL | Status: DC | PRN

## 2017-12-21 MED ORDER — LACTATED RINGERS IV SOLN
INTRAVENOUS | Status: DC
  Administered 2017-12-21 (×2): via INTRAVENOUS

## 2017-12-21 MED ORDER — MORPHINE SULFATE (PF) 0.5 MG/ML IJ SOLN
INTRAMUSCULAR | Status: AC
  Filled 2017-12-21: qty 10

## 2017-12-21 MED ORDER — SCOPOLAMINE 1 MG/3DAYS TD PT72
MEDICATED_PATCH | TRANSDERMAL | Status: DC | PRN
  Administered 2017-12-21: 1 via TRANSDERMAL

## 2017-12-21 MED ORDER — OXYCODONE HCL 5 MG PO TABS: 5.0000 mg | ORAL_TABLET | Freq: Once | ORAL | Status: DC | PRN

## 2017-12-21 MED ORDER — THYROID 30 MG PO TABS
75.0000 mg | ORAL_TABLET | Freq: Every day | ORAL | Status: DC
  Administered 2017-12-22 – 2017-12-24 (×3): 75 mg via ORAL
  Filled 2017-12-21 (×3): qty 3

## 2017-12-21 MED ORDER — ONDANSETRON HCL 4 MG/2ML IJ SOLN
INTRAMUSCULAR | Status: AC
Start: 2017-12-21 — End: 2017-12-21
  Filled 2017-12-21: qty 2

## 2017-12-21 MED ORDER — SCOPOLAMINE 1 MG/3DAYS TD PT72
MEDICATED_PATCH | TRANSDERMAL | Status: AC
  Filled 2017-12-21: qty 1

## 2017-12-21 MED ORDER — METHYLERGONOVINE MALEATE 0.2 MG PO TABS: 0.2000 mg | ORAL_TABLET | ORAL | Status: DC | PRN

## 2017-12-21 MED ORDER — IBUPROFEN 600 MG PO TABS
600.0000 mg | ORAL_TABLET | Freq: Four times a day (QID) | ORAL | Status: DC
  Administered 2017-12-21 – 2017-12-24 (×10): 600 mg via ORAL
  Filled 2017-12-21 (×11): qty 1

## 2017-12-21 MED ORDER — COCONUT OIL OIL: 1.0000 "application " | TOPICAL_OIL | Status: DC | PRN

## 2017-12-21 MED ORDER — TETANUS-DIPHTH-ACELL PERTUSSIS 5-2.5-18.5 LF-MCG/0.5 IM SUSP: 0.5000 mL | Freq: Once | INTRAMUSCULAR | Status: DC

## 2017-12-21 MED ORDER — SODIUM CHLORIDE 0.9% FLUSH: 3.0000 mL | INTRAVENOUS | Status: DC | PRN

## 2017-12-21 MED ORDER — KETOROLAC TROMETHAMINE 30 MG/ML IJ SOLN
INTRAMUSCULAR | Status: AC
  Filled 2017-12-21: qty 1

## 2017-12-21 MED ORDER — DIPHENHYDRAMINE HCL 25 MG PO CAPS: 25.0000 mg | ORAL_CAPSULE | ORAL | Status: DC | PRN

## 2017-12-21 MED ORDER — SIMETHICONE 80 MG PO CHEW
80.0000 mg | CHEWABLE_TABLET | Freq: Three times a day (TID) | ORAL | Status: DC
Start: 2017-12-21 — End: 2017-12-24
  Administered 2017-12-22 – 2017-12-24 (×5): 80 mg via ORAL
  Filled 2017-12-21 (×5): qty 1

## 2017-12-21 MED ORDER — SALINE SPRAY 0.65 % NA SOLN
1.0000 | NASAL | Status: DC | PRN
  Administered 2017-12-22: 1 via NASAL
  Filled 2017-12-21: qty 44

## 2017-12-21 MED ORDER — PRENATAL MULTIVITAMIN CH
1.0000 | ORAL_TABLET | Freq: Every day | ORAL | Status: DC
Start: 2017-12-21 — End: 2017-12-24
  Filled 2017-12-21: qty 1

## 2017-12-21 MED ORDER — NALOXONE HCL 4 MG/10ML IJ SOLN: 1.0000 ug/kg/h | INTRAVENOUS | Status: DC | PRN

## 2017-12-21 MED ORDER — BUPIVACAINE IN DEXTROSE 0.75-8.25 % IT SOLN
INTRATHECAL | Status: DC | PRN
  Administered 2017-12-21: 1.6 mL via INTRATHECAL

## 2017-12-21 MED ORDER — DIPHENHYDRAMINE HCL 50 MG/ML IJ SOLN: 12.5000 mg | INTRAMUSCULAR | Status: DC | PRN

## 2017-12-21 MED ORDER — PROMETHAZINE HCL 25 MG/ML IJ SOLN: 6.2500 mg | INTRAMUSCULAR | Status: DC | PRN

## 2017-12-21 MED ORDER — WITCH HAZEL-GLYCERIN EX PADS: 1.0000 "application " | MEDICATED_PAD | CUTANEOUS | Status: DC | PRN

## 2017-12-21 MED ORDER — FENTANYL CITRATE (PF) 100 MCG/2ML IJ SOLN
INTRAMUSCULAR | Status: AC
  Filled 2017-12-21: qty 2

## 2017-12-21 MED ORDER — METHYLERGONOVINE MALEATE 0.2 MG/ML IJ SOLN: 0.2000 mg | INTRAMUSCULAR | Status: DC | PRN

## 2017-12-21 MED ORDER — CEFAZOLIN SODIUM-DEXTROSE 2-4 GM/100ML-% IV SOLN
2.0000 g | INTRAVENOUS | Status: AC
  Administered 2017-12-21: 2 g via INTRAVENOUS
  Filled 2017-12-21: qty 100

## 2017-12-21 MED ORDER — BUPIVACAINE-EPINEPHRINE (PF) 0.25% -1:200000 IJ SOLN
INTRAMUSCULAR | Status: AC
  Filled 2017-12-21: qty 30

## 2017-12-21 MED ORDER — OXYCODONE-ACETAMINOPHEN 5-325 MG PO TABS: 2.0000 | ORAL_TABLET | ORAL | Status: DC | PRN

## 2017-12-21 MED ORDER — PHENYLEPHRINE 40 MCG/ML (10ML) SYRINGE FOR IV PUSH (FOR BLOOD PRESSURE SUPPORT)
PREFILLED_SYRINGE | INTRAVENOUS | Status: DC | PRN
  Administered 2017-12-21: 40 ug via INTRAVENOUS

## 2017-12-21 MED ORDER — DIBUCAINE 1 % RE OINT: 1.0000 "application " | TOPICAL_OINTMENT | RECTAL | Status: DC | PRN

## 2017-12-21 MED ORDER — SCOPOLAMINE 1 MG/3DAYS TD PT72: 1.0000 | MEDICATED_PATCH | Freq: Once | TRANSDERMAL | Status: DC

## 2017-12-21 SURGICAL SUPPLY — 36 items
ADH SKN CLS APL DERMABOND .7 (GAUZE/BANDAGES/DRESSINGS) ×1
CHLORAPREP W/TINT 26ML (MISCELLANEOUS) ×2 IMPLANT
CLAMP CORD UMBIL (MISCELLANEOUS) IMPLANT
CLOTH BEACON ORANGE TIMEOUT ST (SAFETY) ×2 IMPLANT
DERMABOND ADVANCED (GAUZE/BANDAGES/DRESSINGS) ×1
DERMABOND ADVANCED .7 DNX12 (GAUZE/BANDAGES/DRESSINGS) IMPLANT
DRSG OPSITE POSTOP 4X10 (GAUZE/BANDAGES/DRESSINGS) ×2 IMPLANT
ELECT REM PT RETURN 9FT ADLT (ELECTROSURGICAL) ×2
ELECTRODE REM PT RTRN 9FT ADLT (ELECTROSURGICAL) ×1 IMPLANT
EXTRACTOR VACUUM M CUP 4 TUBE (SUCTIONS) IMPLANT
GLOVE BIO SURGEON STRL SZ7.5 (GLOVE) ×2 IMPLANT
GLOVE BIOGEL PI IND STRL 7.0 (GLOVE) ×1 IMPLANT
GLOVE BIOGEL PI INDICATOR 7.0 (GLOVE) ×1
GOWN STRL REUS W/TWL LRG LVL3 (GOWN DISPOSABLE) ×4 IMPLANT
KIT ABG SYR 3ML LUER SLIP (SYRINGE) IMPLANT
NDL HYPO 25X5/8 SAFETYGLIDE (NEEDLE) IMPLANT
NDL SPNL 20GX3.5 QUINCKE YW (NEEDLE) IMPLANT
NEEDLE HYPO 22GX1.5 SAFETY (NEEDLE) ×2 IMPLANT
NEEDLE HYPO 25X5/8 SAFETYGLIDE (NEEDLE) IMPLANT
NEEDLE SPNL 20GX3.5 QUINCKE YW (NEEDLE) IMPLANT
NS IRRIG 1000ML POUR BTL (IV SOLUTION) ×2 IMPLANT
PACK C SECTION WH (CUSTOM PROCEDURE TRAY) ×2 IMPLANT
PENCIL SMOKE EVAC W/HOLSTER (ELECTROSURGICAL) ×2 IMPLANT
SUT MNCRL 0 VIOLET CTX 36 (SUTURE) ×2 IMPLANT
SUT MNCRL AB 3-0 PS2 27 (SUTURE) IMPLANT
SUT MON AB 2-0 CT1 27 (SUTURE) ×2 IMPLANT
SUT MON AB-0 CT1 36 (SUTURE) ×4 IMPLANT
SUT MONOCRYL 0 CTX 36 (SUTURE) ×2
SUT PLAIN 0 NONE (SUTURE) IMPLANT
SUT PLAIN 2 0 (SUTURE)
SUT PLAIN 2 0 XLH (SUTURE) IMPLANT
SUT PLAIN ABS 2-0 CT1 27XMFL (SUTURE) IMPLANT
SYR 20CC LL (SYRINGE) IMPLANT
SYR CONTROL 10ML LL (SYRINGE) ×2 IMPLANT
TOWEL OR 17X24 6PK STRL BLUE (TOWEL DISPOSABLE) ×2 IMPLANT
TRAY FOLEY W/BAG SLVR 14FR LF (SET/KITS/TRAYS/PACK) ×2 IMPLANT

## 2017-12-21 NOTE — Progress Notes (Signed)
Patient ID: Deanna Hicks, female   DOB: 1985/09/06, 32 y.o.   MRN: [id number] Patient seen and examined. Consent witnessed and signed. No changes noted. Update completed.

## 2017-12-21 NOTE — Transfer of Care (Signed)
Immediate Anesthesia Transfer of Care Note  Patient: Deanna Hicks  Procedure(s) Performed: Repeat CESAREAN SECTION (N/A )  Patient Location: PACU  Anesthesia Type:Spinal  Level of Consciousness: awake, alert  and oriented  Airway & Oxygen Therapy: Patient Spontanous Breathing  Post-op Assessment: Report given to RN and Post -op Vital signs reviewed and stable  Post vital signs: Reviewed and stable  Last Vitals:  Vitals Value Taken Time  BP 107/52 12/21/2017  1:00 PM  Temp    Pulse    Resp 12 12/21/2017  1:01 PM  SpO2    Vitals shown include unvalidated device data.  Last Pain:  Vitals:   12/21/17 1014  TempSrc: Axillary  PainSc: 0-No pain         Complications: No apparent anesthesia complications

## 2017-12-21 NOTE — Anesthesia Postprocedure Evaluation (Signed)
Anesthesia Post Note  Patient: Deanna Hicks  Procedure(s) Performed: Repeat CESAREAN SECTION (N/A )     Patient location during evaluation: PACU Anesthesia Type: Spinal Level of consciousness: oriented and awake and alert Pain management: pain level controlled Vital Signs Assessment: post-procedure vital signs reviewed and stable Respiratory status: spontaneous breathing and respiratory function stable Cardiovascular status: blood pressure returned to baseline and stable Postop Assessment: no headache, no backache and no apparent nausea or vomiting Anesthetic complications: no    Last Vitals:  Vitals:   12/21/17 1345 12/21/17 1400  BP: (!) 105/53 108/64  Pulse: 93 97  Resp: (!) 22 19  Temp: 36.7 C   SpO2: 100% 100%    Last Pain:  Vitals:   12/21/17 1345  TempSrc: Oral  PainSc:    Pain Goal:                 Lowella Curb

## 2017-12-21 NOTE — H&P (Signed)
Deanna Hicks is a 32 y.o. female presenting for rpt csection . OB History    Gravida  3   Para  1   Term      Preterm      AB  1   Living  1     SAB  1   TAB      Ectopic      Multiple  0   Live Births  1          Past Medical History:  Diagnosis Date  . Endometriosis   . Gestational diabetes   . 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 Hypothyroidism   . Postpartum care following cesarean delivery (6/7) 02/01/2015   Past Surgical History:  Procedure Laterality Date  . CESAREAN SECTION N/A 02/01/2015   Procedure: Primary CESAREAN SECTION;  Surgeon: Olivia Mackie, MD;  Location: [facility];  Service: Obstetrics;  Laterality: N/A;  EDD: 02/02/15. approved by Myra   Family History: family history includes Diabetes in her father; Endometriosis in her mother and sister; Heart disease in her father; Prostate cancer in her maternal grandfather. Social History:  reports that she has never smoked. She has never used smokeless tobacco. She reports that she drinks alcohol. She reports that she does not use drugs.     Maternal Diabetes: Yes:  Diabetes Type:  Diet controlled Genetic Screening: Normal Maternal Ultrasounds/Referrals: Normal Fetal Ultrasounds or other Referrals:  None Maternal Substance Abuse:  No Significant Maternal Medications:  None Significant Maternal Lab Results:  None Other Comments:  None  Review of Systems  Constitutional: Negative.   All other systems reviewed and are negative.  Maternal Medical History:  Contractions: Onset was less than 1 hour ago.   Frequency: rare.   Perceived severity is mild.    Fetal activity: Perceived fetal activity is normal.   Last perceived fetal movement was within the past hour.    Prenatal complications: no prenatal complications Prenatal Complications - Diabetes: gestational. Diabetes is managed by diet.        Last menstrual period 03/22/2017, unknown if currently breastfeeding. Maternal  Exam:  Uterine Assessment: Contraction strength is mild.  Contraction frequency is rare.   Abdomen: Patient reports no abdominal tenderness. Surgical scars: low transverse.   Fetal presentation: vertex  Introitus: Normal vulva. Normal vagina.  Ferning test: not done.  Nitrazine test: not done. Amniotic fluid character: not assessed.  Pelvis: questionable for delivery.   Cervix: Cervix evaluated by digital exam.     Physical Exam  Nursing note and vitals reviewed. Constitutional: She is oriented to person, place, and time. She appears well-developed and well-nourished.  HENT:  Head: Normocephalic and atraumatic.  Neck: Normal range of motion. Neck supple.  Cardiovascular: Normal rate and regular rhythm.  Respiratory: Effort normal and breath sounds normal.  GI: Soft. Bowel sounds are normal.  Genitourinary: Vagina normal and uterus normal.  Musculoskeletal: Normal range of motion.  Neurological: She is alert and oriented to person, place, and time. She has normal reflexes.  Skin: Skin is warm and dry.  Psychiatric: She has a normal mood and affect.    Prenatal labs: ABO, Rh: --/--/O POS (04/26 1243) Antibody: NEG (04/26 1243) Rubella: Immune (10/10 0000) RPR: Non Reactive (04/26 1243)  HBsAg: Negative (10/10 0000)  HIV: Non-reactive (10/10 0000)  GBS: Negative (04/01 0000)   Assessment/Plan: 39 wk IUP Previous csection GDM Rpt csection Consent done   Deanna Hicks 12/21/2017, 8:35 AM

## 2017-12-21 NOTE — Anesthesia Procedure Notes (Signed)
Spinal  Patient location during procedure: OB Start time: 12/21/2017 11:49 AM End time: 12/21/2017 11:54 AM Staffing Anesthesiologist: Lowella Curb, MD Performed: anesthesiologist  Preanesthetic Checklist Completed: patient identified, surgical consent, pre-op evaluation, timeout performed, IV checked, risks and benefits discussed and monitors and equipment checked Spinal Block Patient position: sitting Prep: site prepped and draped and DuraPrep Patient monitoring: heart rate, cardiac monitor, continuous pulse ox and blood pressure Approach: midline Location: L3-4 Injection technique: single-shot Needle Needle type: Pencan  Needle gauge: 24 G Needle length: 10 cm Assessment Sensory level: T4

## 2017-12-21 NOTE — Op Note (Signed)
Cesarean Section Procedure Note  Indications: previous uterine incision kerr x one and GDM  Pre-operative Diagnosis: 39 week 1 day pregnancy.  Post-operative Diagnosis: same  Surgeon: Lenoard Aden   Assistants: Renae Fickle, CNM  Anesthesia: Local anesthesia 0.25.% bupivacaine and Spinal anesthesia  ASA Class: 2  Procedure Details  The patient was seen in the Holding Room. The risks, benefits, complications, treatment options, and expected outcomes were discussed with the patient.  The patient concurred with the proposed plan, giving informed consent. The risks of anesthesia, infection, bleeding and possible injury to other organs discussed. Injury to bowel, bladder, or ureter with possible need for repair discussed. Possible need for transfusion with secondary risks of hepatitis or HIV acquisition discussed. Post operative complications to include but not limited to DVT, PE and Pneumonia noted. The site of surgery properly noted/marked. The patient was taken to Operating Room # 9, identified as Deanna Hicks and the procedure verified as C-Section Delivery. A Time Out was held and the above information confirmed.  After induction of anesthesia, the patient was draped and prepped in the usual sterile manner. A Pfannenstiel incision was made and carried down through the subcutaneous tissue to the fascia. Fascial incision was made and extended transversely using Mayo scissors. The fascia was separated from the underlying rectus tissue superiorly and inferiorly. The peritoneum was identified and entered. Peritoneal incision was extended longitudinally. The utero-vesical peritoneal reflection was incised transversely and the bladder flap was bluntly freed from the lower uterine segment. A low transverse uterine incision(Kerr hysterotomy) was made. Delivered from OA presentation was a  female with Apgar scores of 9 at one minute and 9 at five minutes. Bulb suctioning gently performed. Neonatal team in  attendance.After the umbilical cord was clamped and cut cord blood was obtained for evaluation. The placenta was removed intact and appeared normal. The uterus was curetted with a dry lap pack. Good hemostasis was noted.The uterine outline, tubes and ovaries appeared normal. The uterine incision was closed with running locked sutures of 0 Monocryl x 2 layers. Hemostasis was observed.The parietal peritoneum was closed with a running 2-0 Monocryl suture. The fascia was then reapproximated with running sutures of 0 Monocryl. The skin was reapproximated with 3-0 monocryl after [place] closure with 2-0 plain in a running fashion.  Instrument, sponge, and needle counts were correct prior the abdominal closure and at the conclusion of the case.   Findings: As noted. OA, anterior placenta.  Estimated Blood Loss:  500         Drains: foley                 Specimens: placenta                 Complications:  None; patient tolerated the procedure well.         Disposition: PACU - hemodynamically stable.         Condition: stable  Attending Attestation: I performed the procedure.

## 2017-12-21 NOTE — Anesthesia Preprocedure Evaluation (Signed)
Anesthesia Evaluation  Patient identified by MRN, date of birth, ID band Patient awake    Reviewed: Allergy & Precautions, NPO status , Patient's Chart, lab work & pertinent test results  Airway Mallampati: II  TM Distance: >3 FB Neck ROM: Full    Dental no notable dental hx. (+) Teeth Intact   Pulmonary neg pulmonary ROS,    Pulmonary exam normal breath sounds clear to auscultation       Cardiovascular negative cardio ROS Normal cardiovascular exam Rhythm:Regular Rate:Normal     Neuro/Psych negative neurological ROS  negative psych ROS   GI/Hepatic Neg liver ROS, GERD  ,  Endo/Other  diabetesHypothyroidism   Renal/GU negative Renal ROS  negative genitourinary   Musculoskeletal negative musculoskeletal ROS (+)   Abdominal   Peds  Hematology negative hematology ROS (+)   Anesthesia Other Findings   Reproductive/Obstetrics (+) Pregnancy Breech presentation HPV                             Anesthesia Physical  Anesthesia Plan  ASA: II  Anesthesia Plan: Spinal   Post-op Pain Management:    Induction:   PONV Risk Score and Plan:   Airway Management Planned: Natural Airway  Additional Equipment:   Intra-op Plan:   Post-operative Plan:   Informed Consent: I have reviewed the patients History and Physical, chart, labs and discussed the procedure including the risks, benefits and alternatives for the proposed anesthesia with the patient or authorized representative who has indicated his/her understanding and acceptance.     Plan Discussed with: CRNA, Anesthesiologist and Surgeon  Anesthesia Plan Comments:         Anesthesia Quick Evaluation

## 2017-12-21 NOTE — Lactation Note (Signed)
This note was copied from a baby's chart. Lactation Consultation Note  Patient Name: Deanna Hicks [id number] Date: 12/21/2017 Reason for consult: Initial assessment;Term  8 hours old FT female who is being exclusively BF by her mother, she's a P2 and experienced BF, she was able to BF her older child for 9 months. Mom had some breastfeeding difficulties though, her first baby had a tongue tied and a lip tied but according the baby's pediatrician it wasn't severe enough to get it fixed. Mom also had issues with her milk supply and had to seek lactation help. But with this baby is different though, mom feels much more confident about BF.  Baby was nursing when entering the room, LC had to correct positioning, mom had baby on cross cradle position and wasn't holding the back of baby's head, just propping it with pillows. Explained to parents that babies don't have any head or neck support at this age and that it's very important to provide that support so they get a deeper latch. Mom verbalized understanding and corrected positioning right away.  Multiple swallows were heard when LC started doing breast compressions, baby had a big wide open mouth with flanged lips when latched on. Both nipples also looked intact upon examination with no signs of trauma, mother states feedings at the breast are comfortable.  Encouraged mom to feed baby 8-12 times/24 hours STS or sooner if feeding cues are present. Reviewed BF brochure, BF resources and feeding diary, parents are aware of LC services and will call PRN.  Maternal Data Formula Feeding for Exclusion: Yes Reason for exclusion: Mother's choice to formula and breast feed on admission Has patient been taught Hand Expression?: Yes Does the patient have breastfeeding experience prior to this delivery?: Yes  Feeding Feeding Type: Breast Fed Length of feed: 20 min  LATCH Score Latch: Grasps breast easily, tongue down, lips flanged, rhythmical  sucking.  Audible Swallowing: A few with stimulation  Type of Nipple: Everted at rest and after stimulation  Comfort (Breast/Nipple): Soft / non-tender  Hold (Positioning): Assistance needed to correctly position infant at breast and maintain latch.  LATCH Score: 8  Interventions Interventions: Breast feeding basics reviewed;Assisted with latch;Skin to skin;Breast massage;Breast compression;Adjust position;Support pillows;Position options  Lactation Tools Discussed/Used WIC Program: No   Consult Status Consult Status: Follow-up Date: 12/22/17 Follow-up type: In-patient    Beonka Amesquita Venetia Constable 12/21/2017, 8:51 PM

## 2017-12-22 ENCOUNTER — Encounter (HOSPITAL_COMMUNITY): Payer: Self-pay | Admitting: Obstetrics and Gynecology

## 2017-12-22 LAB — CBC
HCT: 29.8 % — ABNORMAL LOW (ref 36.0–46.0)
Hemoglobin: 10.6 g/dL — ABNORMAL LOW (ref 12.0–15.0)
MCH: 31.6 pg (ref 26.0–34.0)
MCHC: 35.6 g/dL (ref 30.0–36.0)
MCV: 89 fL (ref 78.0–100.0)
Platelets: 187 10*3/uL (ref 150–400)
RBC: 3.35 MIL/uL — ABNORMAL LOW (ref 3.87–5.11)
RDW: 13.8 % (ref 11.5–15.5)
WBC: 7.4 10*3/uL (ref 4.0–10.5)

## 2017-12-22 MED ORDER — GUAIFENESIN-DM 100-10 MG/5ML PO SYRP
5.0000 mL | ORAL_SOLUTION | ORAL | Status: DC | PRN
  Administered 2017-12-22 (×2): 5 mL via ORAL
  Filled 2017-12-22 (×3): qty 5

## 2017-12-22 NOTE — Progress Notes (Signed)
Subjective: POD# 1 Information for the patient's newborn:  Deanna Hicks, Deanna Hicks [[id number]]  female  Baby name: Samson Frederic  Reports feeling well, URI symptoms (cough and sinus congestion), some dizziness with standing up, resolving after a few seconds. Feeding: breast Patient reports tolerating PO.  Breast symptoms: none Pain controlled with PO meds Denies HA/SOB/C/P/N/V/dizziness. Flatus present. She reports vaginal bleeding as normal, without clots.  Minimal ambulation, cath still in place.   Objective:   VS:    Vitals:   12/22/17 0422 12/22/17 0547 12/22/17 0710 12/22/17 0810  BP:  95/62  (!) 93/56  Pulse:  83  91  Resp:  16  18  Temp:  100 F (37.8 C)  98.2 F (36.8 C)  TempSrc:  Oral  Oral  SpO2: 99% 96% 97% 99%  Weight:      Height:          Intake/Output Summary (Last 24 hours) at 12/22/2017 1055 Last data filed at 12/22/2017 0810 Gross per 24 hour  Intake 4116.67 ml  Output 4169 ml  Net -52.33 ml        Recent Labs    12/20/17 1243 12/22/17 0607  WBC 8.0 7.4  HGB 14.4 10.6*  HCT 40.2 29.8*  PLT 240 187     Blood type: --/--/O POS (04/26 1243)  Rubella: Immune (10/10 0000)     Physical Exam:  General: alert, cooperative and no distress CV: Regular rate and rhythm Resp: clear Abdomen: soft, nontender, normal bowel sounds Incision: clean, dry and intact Uterine Fundus: firm, below umbilicus, nontender Lochia: minimal Ext: no edema, redness or tenderness in the calves or thighs      Assessment/Plan: 32 y.o.   POD# 1. [id number]                  Principal Problem:   Cesarean delivery delivered - indication: repeat 4/27 Active Problems:   Hypothyroidism  - stable on synthroid   Postpartum care following cesarean delivery    Previous cesarean delivery affecting pregnancy   Doing well, stable.    DC foley cath OOB to ambulate and warm fluids Guaifenesin cough syrup PRN  Routine post-op care  Neta Mends, CNM, MSN 12/22/2017, 10:55  AM

## 2017-12-22 NOTE — Lactation Note (Signed)
This note was copied from a baby's chart. Lactation Consultation Note  Patient Name: Girl Tina Gruner [id number] Date: 12/22/2017 Reason for consult: Follow-up assessment;Term;Infant weight loss  FOB asked for LC assistance for spoon feeding, mom was hand expressing when entering the room, but she didn't need assistance with spoon feeding but hand expressing. Helped her to hand express and she got another 3 ml of colostrum, this time mom was the one spoon feeding baby.   Offered again to set up a DEBP and mom agreed, she'll start pumping tonight every 3 hours preferably after feeding baby. Reviewed pump instructions, storage and guidelines; as well as milk storage guidelines. She'll continue with the plan adding the pumping schedule instead of hand expressing, it was too troublesome for her. Parents did not have any other questions or concerns at this point. Lactation to follow up tomorrow on weight loss.  Maternal Data    Feeding Feeding Type: Breast Milk Length of feed: 10 min(baby was still nursing when exiting the room)  LATCH Score Latch: Grasps breast easily, tongue down, lips flanged, rhythmical sucking.  Audible Swallowing: Spontaneous and intermittent  Type of Nipple: Everted at rest and after stimulation  Comfort (Breast/Nipple): Soft / non-tender  Hold (Positioning): No assistance needed to correctly position infant at breast.  LATCH Score: 10  Interventions Interventions: Breast feeding basics reviewed;Assisted with latch;Skin to skin;Breast massage;Hand express;Breast compression;Support pillows;Expressed milk  Lactation Tools Discussed/Used Tools: Other (comment)(spoon) Pump Review: Setup, frequency, and cleaning;Milk Storage Initiated by:: MPeck Date initiated:: 12/22/17   Consult Status Consult Status: Follow-up Date: 12/23/17 Follow-up type: In-patient    Lynise Porr Venetia Constable 12/22/2017, 8:13 PM

## 2017-12-22 NOTE — Anesthesia Postprocedure Evaluation (Signed)
Anesthesia Post Note  Patient: Deanna Hicks  Procedure(s) Performed: Repeat CESAREAN SECTION (N/A )     Patient location during evaluation: Mother Baby Anesthesia Type: Spinal Level of consciousness: awake and alert Pain management: pain level controlled Vital Signs Assessment: post-procedure vital signs reviewed and stable Respiratory status: spontaneous breathing, nonlabored ventilation and respiratory function stable Cardiovascular status: stable Postop Assessment: no headache, no backache, spinal receding, adequate PO intake, no apparent nausea or vomiting and patient able to bend at knees Anesthetic complications: no    Last Vitals:  Vitals:   12/22/17 0422 12/22/17 0547  BP:  95/62  Pulse:  83  Resp:  16  Temp:  37.8 C  SpO2: 99% 96%    Last Pain:  Vitals:   12/22/17 0634  TempSrc:   PainSc: 8    Pain Goal:                 Laban Emperor

## 2017-12-22 NOTE — Progress Notes (Signed)
Have not yet removed pt. Foley catheter due to pt not being able to ambulate. Feels light headed when standing and stats she's not comfortable with walking at this point. Systolic BP in lower 90s for both sets of orthostatics done on pt during shift.

## 2017-12-22 NOTE — Lactation Note (Signed)
This note was copied from a baby's chart. Lactation Consultation Note  Patient Name: Deanna Hicks [id number] Date: 12/22/2017 Reason for consult: Follow-up assessment;Term  62 hours old female who is still being exclusively BF by her mother, baby is now at 5% weight loss. Per mom BF is going well and feedings at the breast are still comfortable. Informed parents about weight loss and offered latch assistance. Baby was already cueing when entering the room, mom put baby to the breast without any assistance and baby latched on right away to the left breast on cross cradle position. Baby had a rhythmical sucking pattern and multiple swallows were heard, baby fed for 10 minutes on that breast.   Spoke to parents how they feel about supplementing baby with mother's milk, offered to set up a pump or working on had expression after each feeding, dad mentioned they have their own personal pump in the car (here at the hospital) they may also consider using that. Mom agreed to hand express and spoon feed in the mean time, LC easily hand expressed 3 cc of colostrum from right breast while baby still nursing on the left one; once baby was done showed mom how to spoon feed baby; baby took the entire volume.  Baby started nursing from the other breast and still nursing when exiting the room. Multiple swallows were also heard, mom is really good about doing breast compressions while infant is at the breast. Parents are aware of supplementation options and will let their RN know when they're ready to have a DEBP set up or just use their own. Encouraged mom to keep feeding baby on cues 8-12 times/24 hours or sooner if feeding cues are present, baby already started cluster feeding. Both parents aware of LC services and will call PRN.  Maternal Data    Feeding Feeding Type: Breast Fed Length of feed: 10 min(baby was still nursing when exiting the room)  LATCH Score Latch: Grasps breast easily, tongue down,  lips flanged, rhythmical sucking.  Audible Swallowing: Spontaneous and intermittent  Type of Nipple: Everted at rest and after stimulation  Comfort (Breast/Nipple): Soft / non-tender  Hold (Positioning): No assistance needed to correctly position infant at breast.  LATCH Score: 10  Interventions Interventions: Breast feeding basics reviewed;Assisted with latch;Skin to skin;Breast massage;Hand express;Breast compression;Support pillows;Expressed milk  Lactation Tools Discussed/Used Tools: Other (comment)(spoon)   Consult Status Consult Status: Follow-up Date: 12/23/17 Follow-up type: In-patient    Deanna Hicks 12/22/2017, 5:38 PM

## 2017-12-22 NOTE — Addendum Note (Signed)
Addendum  created 12/22/17 0816 by Ruel Dimmick H, CRNA   Sign clinical note    

## 2017-12-23 ENCOUNTER — Encounter (HOSPITAL_COMMUNITY): Payer: Self-pay | Admitting: *Deleted

## 2017-12-23 LAB — GLUCOSE, CAPILLARY: Glucose-Capillary: 113 mg/dL — ABNORMAL HIGH (ref 65–99)

## 2017-12-23 NOTE — Progress Notes (Signed)
Subjective: POD# 2 Information for the patient's newborn:  Deanna Hicks, Deanna Hicks [[id number]]  female  Baby name: Deanna Hicks  Reports feeling tired, had bout of cough which made her throw up last night. Cough episodes rare but spasmodic when occurs. Minimal relief from cough drops and syrup. Feeding: breast Patient reports tolerating PO.  Breast symptoms: no difficulties Pain controlled with PO meds Denies HA/SOB/C/P/N/V/dizziness. Flatus present. She reports vaginal bleeding as normal, without clots.  She is ambulating, urinating without difficulty.     Objective:   VS:    Vitals:   12/22/17 0810 12/22/17 1300 12/22/17 1811 12/23/17 0454  BP: (!) 93/56 94/60 101/64 111/70  Pulse: 91 83 85 89  Resp: Temp: 98.2 F (36.8 C) 99.3 F (37.4 C) 98.7 F (37.1 C) 98.7 F (37.1 C)  TempSrc: Oral Oral Oral Oral  SpO2: 99% 99%    Weight:      Height:          Intake/Output Summary (Last 24 hours) at 12/23/2017 0819 Last data filed at 12/22/2017 1700 Gross per 24 hour  Intake -  Output 1700 ml  Net -1700 ml        Recent Labs    12/20/17 1243 12/22/17 0607  WBC 8.0 7.4  HGB 14.4 10.6*  HCT 40.2 29.8*  PLT 240 187     Blood type: --/--/O POS (04/26 1243)  Rubella: Immune (10/10 0000)     Physical Exam:  General: alert, cooperative and no distress CV: Regular rate and rhythm Resp: clear Abdomen: soft, nontender, normal bowel sounds Incision: clean, dry and intact Uterine Fundus: firm, below umbilicus, nontender Lochia: minimal Ext: no edema, redness or tenderness in the calves or thighs      Assessment/Plan: 32 y.o.   POD# 2. [id number]                  Principal Problem:   Cesarean delivery delivered - indication: repeat 4/27 Active Problems:   Hypothyroidism  - stable on synthroid   Postpartum care following cesarean delivery    Previous cesarean delivery affecting pregnancy URI/cough  - continue comfort measures, declines cough syrup w/  codeine  Doing well, stable.    Routine post-op care Anticipate DC in AM  Neta Mends, CNM, MSN 12/23/2017, 8:19 AM

## 2017-12-23 NOTE — Lactation Note (Signed)
This note was copied from a baby's chart. Lactation Consultation Note  Patient Name: Deanna Hicks [id number] Date: 12/23/2017   "[name]" is nursing very well. Swallows are frequent w/a suck: swallow ratio of 1:1. Infant is currently 68 hours old & has had 13 stools since birth.   Mom reports that her milk did not come to volume until about the 5th day postpartum with her 1st child. However, Mom already feels that her breasts are heavier.  Mom has a [employer]ra pump at home. Mom knows how to hand express & she was shown how to assemble & use hand pump that was included in the [employer] pump kit. Although Mom's nipple diameter suggests that a size 24 flange would be appropriate for her, she prefers to use a size 27 flange b/c of prior experience with her 1st child.   Parents' questions answered; parents pleased w/how well "[name]" is doing at the breast.   Matthias Hughs [facility] 12/23/2017, 8:17 PM

## 2017-12-24 MED ORDER — OXYCODONE-ACETAMINOPHEN 5-325 MG PO TABS: 2.0000 | ORAL_TABLET | ORAL | 0 refills | Status: DC | PRN

## 2017-12-24 NOTE — Discharge Summary (Signed)
OB Discharge Summary  Patient Name: Deanna Hicks DOB: 1985/11/13 MRN: [id number]  Date of admission: 12/21/2017 Delivering MD: Olivia Mackie   Date of discharge: 12/24/2017  Admitting diagnosis: Previous Cesarean Section Intrauterine pregnancy: [redacted]w[redacted]d     Secondary diagnosis:Principal Problem:   Cesarean delivery delivered - indication: repeat 4/27 Active Problems:   Hypothyroidism   Postpartum care following cesarean delivery    Previous cesarean delivery affecting pregnancy  Additional problems:A1GDM     Discharge diagnosis: Term Pregnancy Delivered                                                                     Post partum procedures:noen  Augmentation: RCS  Complications: None  Hospital course:  Sceduled C/S   32 y.o. yo [id number] at [redacted]w[redacted]d was admitted to the hospital 12/21/2017 for scheduled cesarean section with the following indication:Elective Repeat.  Membrane Rupture Time/Date: 12:15 PM ,12/21/2017   Patient delivered a Viable infant.12/21/2017  Details of operation can be found in separate operative note.  Pateint had an uncomplicated postpartum course.  She is ambulating, tolerating a regular diet, passing flatus, and urinating well. Patient is discharged home in stable condition on  12/24/17         Physical exam  Vitals:   12/22/17 1811 12/23/17 0454 12/23/17 1824 12/24/17 0604  BP: 101/64 111/70 106/69 101/73  Pulse: 85 89 86 80  Resp: Temp: 98.7 F (37.1 C) 98.7 F (37.1 C) 98.3 F (36.8 C) 98 F (36.7 C)  TempSrc: Oral Oral Oral Oral  SpO2:      Weight:      Height:       General: alert and cooperative Lochia: appropriate Uterine Fundus: firm Incision: Healing well with no significant drainage DVT Evaluation: No evidence of DVT seen on physical exam. Labs: Lab Results  Component Value Date   WBC 7.4 12/22/2017   HGB 10.6 (L) 12/22/2017   HCT 29.8 (L) 12/22/2017   MCV 89.0 12/22/2017   PLT 187 12/22/2017   CMP  Latest Ref Rng & Units 09/26/2012  Glucose 70 - 99 mg/dL 91    Discharge instruction: per After Visit Summary and "Baby and Me Booklet".  After Visit Meds:  Allergies as of 12/24/2017   No Known Allergies     Medication List    TAKE these medications   ARMOUR THYROID 60 MG tablet Generic drug:  thyroid Take 75 mg by mouth daily before breakfast.  and 15 mg every morning.   DHA PO Take 1 tablet by mouth daily.   oxyCODONE-acetaminophen 5-325 MG tablet Commonly known as:  PERCOCET/ROXICET Take 2 tablets by mouth every 4 (four) hours as needed (pain scale > 7).   PRENATAL VITAMINS PO Take 1 tablet by mouth 3 (three) times daily.       Diet: routine diet  Activity: Advance as tolerated. Pelvic rest for 6 weeks.   Outpatient follow up:6 weeks Follow up Appt:No future appointments. Follow up visit: No follow-ups on file.  Postpartum contraception: Not Discussed  Newborn Data: Live born female  Birth Weight: 7 lb 15 oz (3600 g) APGAR: 9, 9  Newborn Delivery   Birth date/time:  12/21/2017 12:16:00 Delivery type:  C-Section, Low Transverse Trial of labor:  No C-section categorization:  Repeat     Baby Feeding: Breast Disposition:home with mother   12/24/2017 Lendon Colonel, MD

## 2017-12-24 NOTE — Lactation Note (Signed)
This note was copied from a baby's chart. Lactation Consultation Note  Patient Name: Deanna Hicks Date: 12/24/2017  Latch scores 10.  Milk is in and baby feeding very well per mom.  No questions or concerns.  Lactation outpatient services and support reviewed and encouraged prn.   Maternal Data    Feeding Feeding Type: Breast Fed Length of feed: 30 min  LATCH Score                   Interventions    Lactation Tools Discussed/Used     Consult Status      Deanna Hicks 12/24/2017, 11:04 AM

## 2018-02-04 IMAGING — US US OB COMP LESS 14 WK
1 series · 15 of 28 positions shown · non-contrast
Comparison: None.

CLINICAL DATA: RIGHT lower quadrant pain beginning at 7 p.m.
Gestational age by last menstrual period 6 weeks and 2 days.

EXAM:
OBSTETRIC <14 WK US AND TRANSVAGINAL OB US
TECHNIQUE: Both transabdominal and transvaginal ultrasound examinations were
performed for complete evaluation of the gestation as well as the
maternal uterus, adnexal regions, and pelvic cul-de-sac.
Transvaginal technique was performed to assess early pregnancy.

[Series 1: us ob comp less 14 wk · 28 acquisitions, 15 frames shown]
[im 1/28]
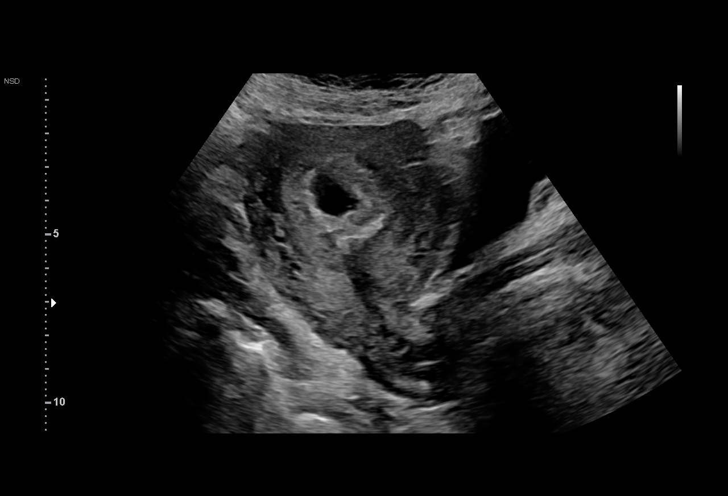
[im 3/28]
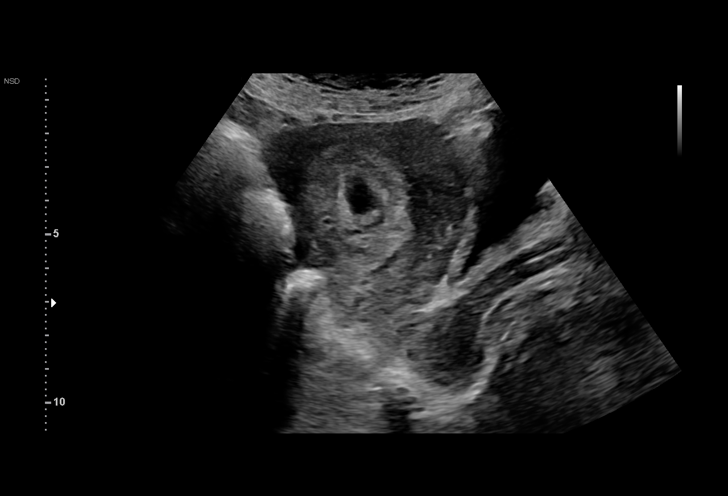
[im 5/28]
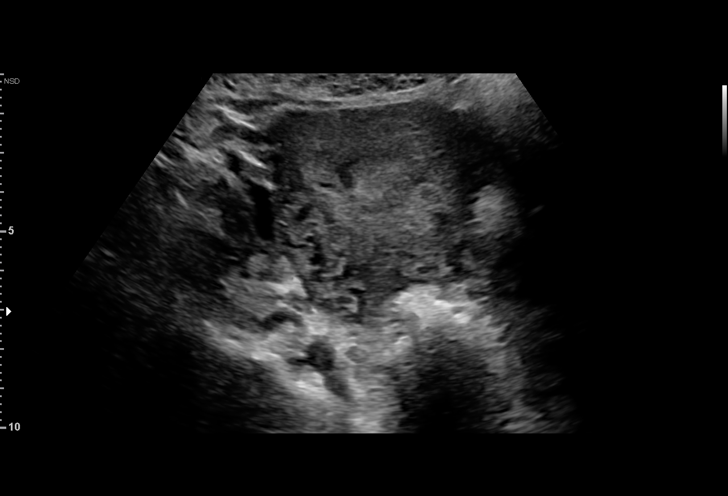
[im 7/28]
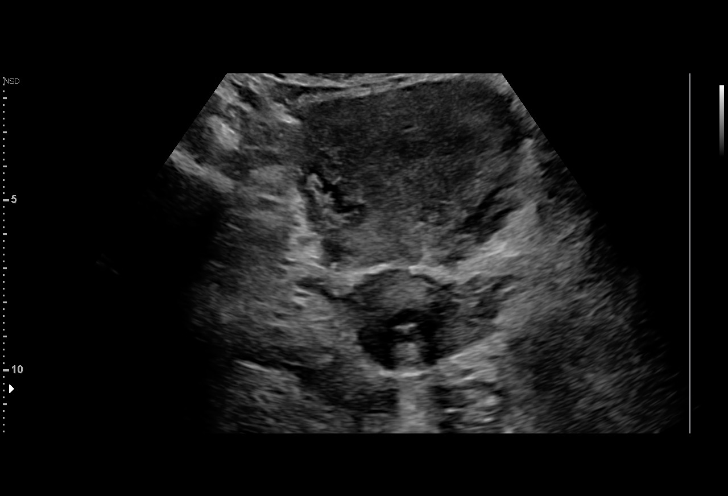
[im 9/28]
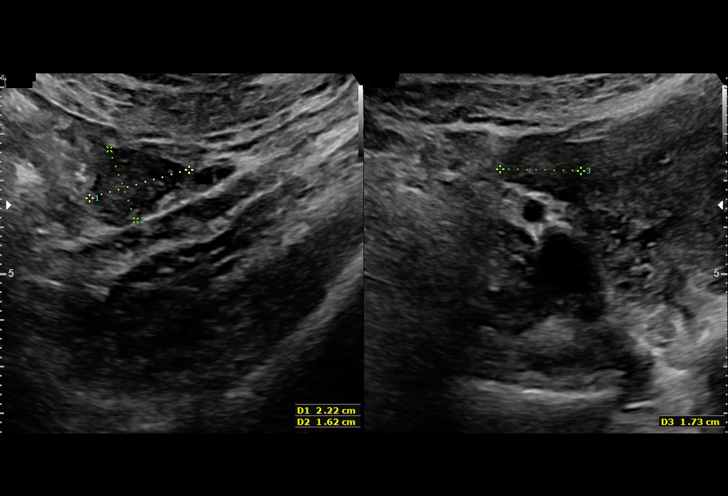
[im 11/28]
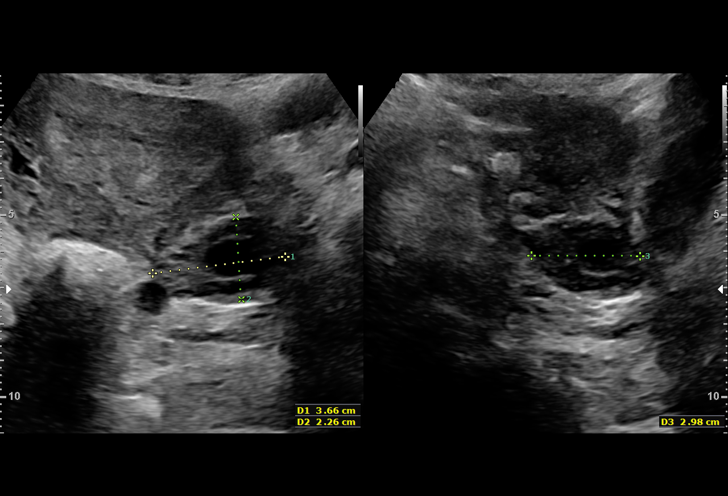
[im 13/28]
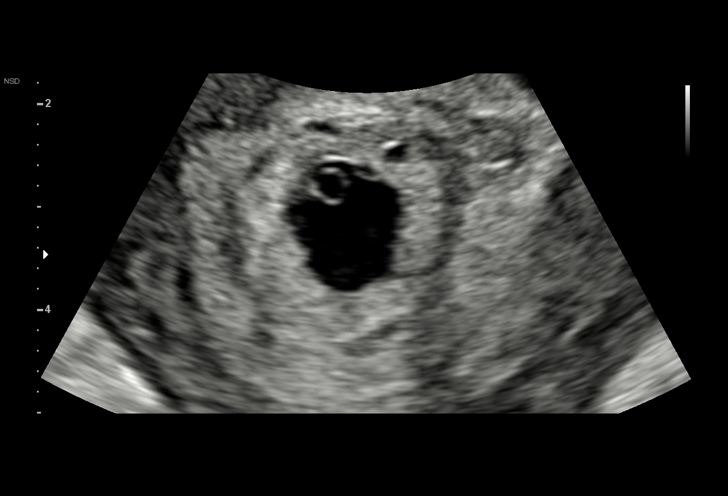
[im 15/28]
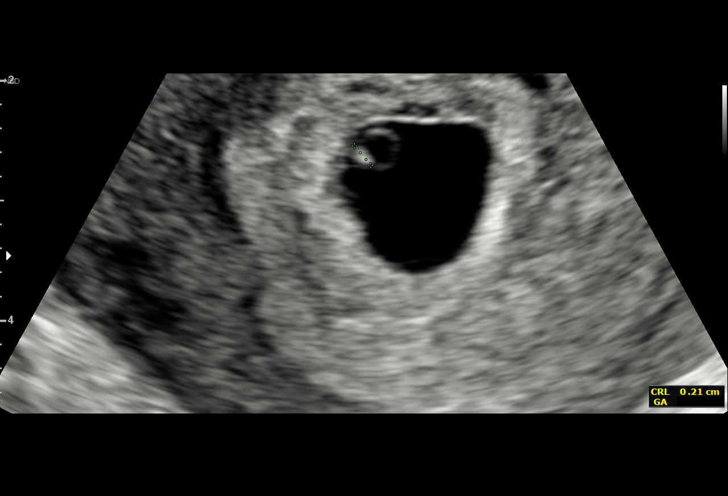
[im 16/28]
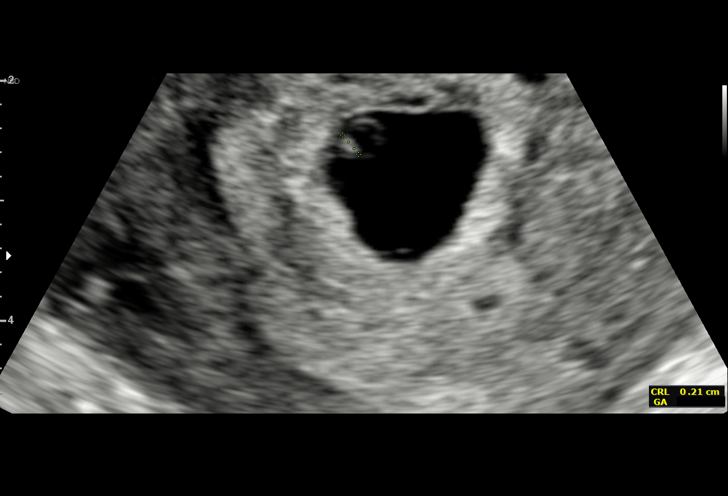
[im 18/28]
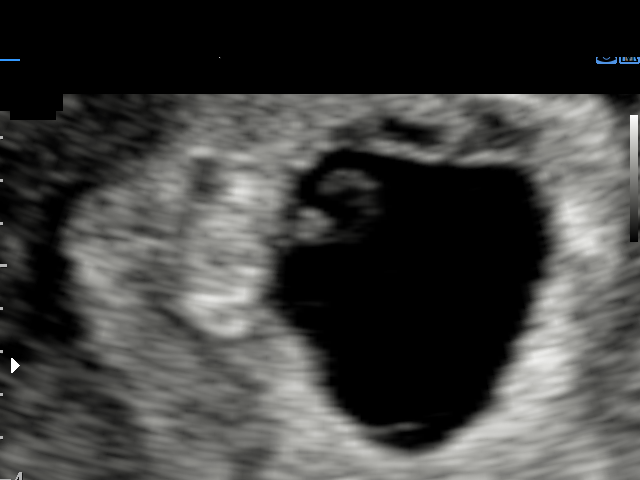
[im 20/28]
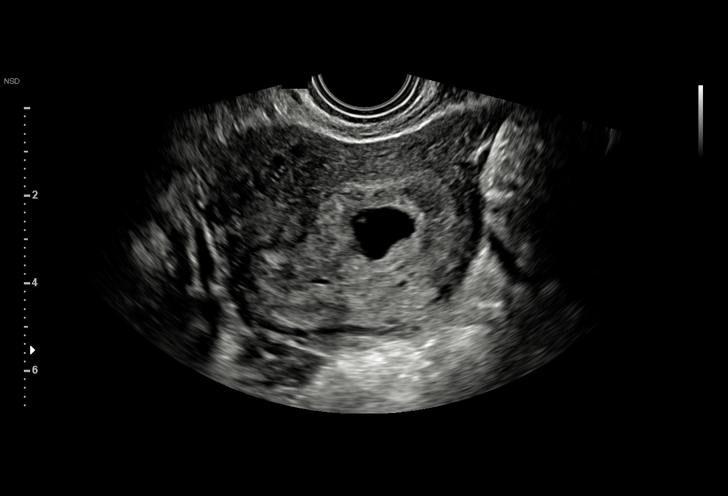
[im 22/28]
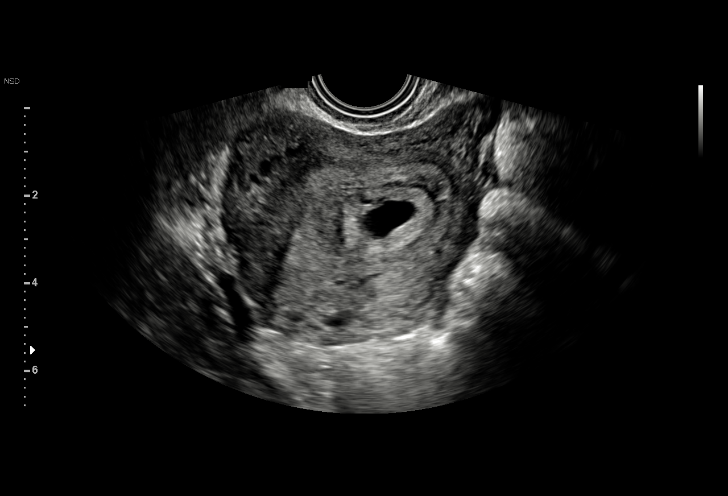
[im 24/28]
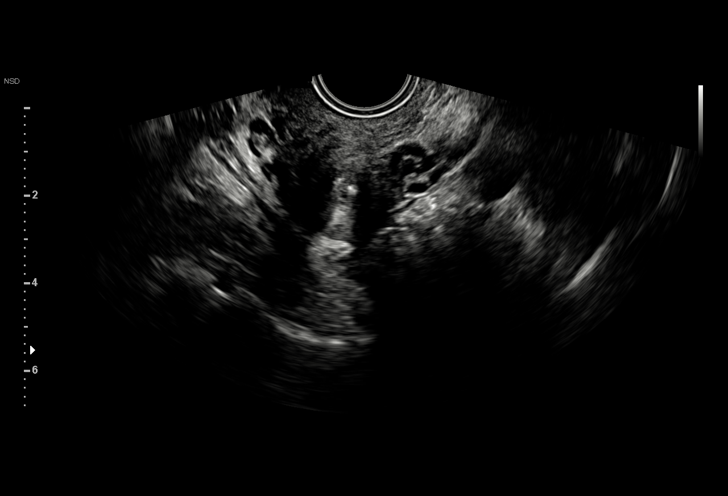
[im 26/28]
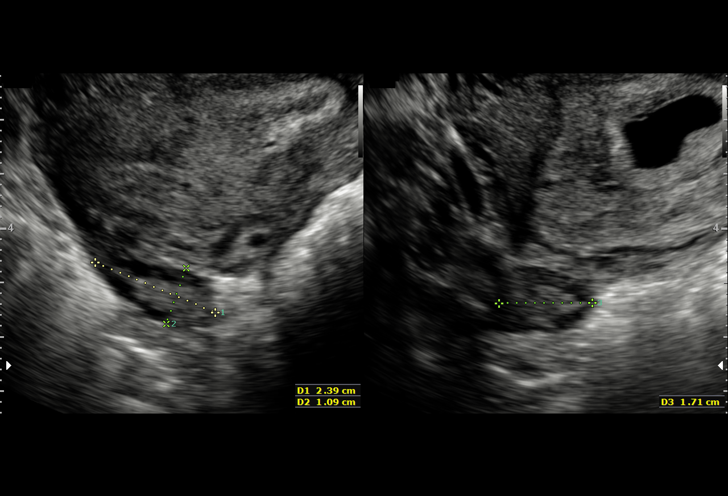
[im 28/28]
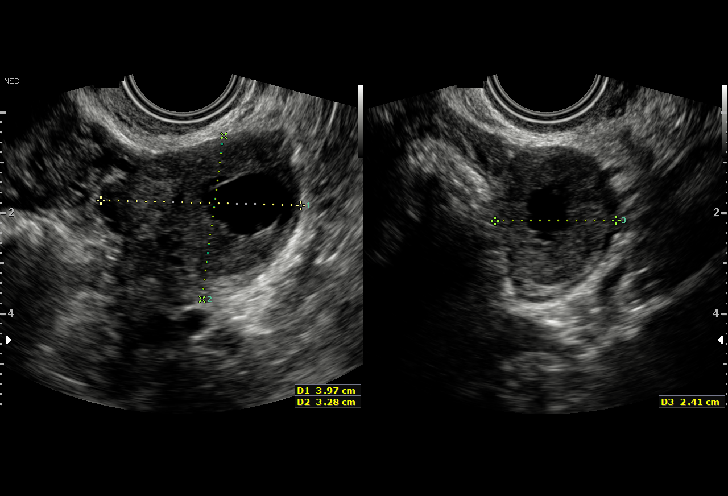

[15 of 28 positions shown; findings below may reference images not displayed]

FINDINGS: Intrauterine gestational sac: Present

Yolk sac:  Present

Embryo:  Present

Cardiac Activity: Present

Heart Rate: 97  bpm

CRL:  2  mm   5 w   5 d                  US EDC: December 31, 2017

Subchorionic hemorrhage:  None visualized.

Maternal uterus/adnexae: LEFT ovarian corpus luteal cyst. No free
fluid.
IMPRESSION: Single live intrauterine pregnancy, gestational age by ultrasound 5
weeks and 5 days without immediate complication.

## 2018-02-06 DIAGNOSIS — Z124 Encounter for screening for malignant neoplasm of cervix: Secondary | ICD-10-CM | POA: Diagnosis not present

## 2018-03-17 DIAGNOSIS — J309 Allergic rhinitis, unspecified: Secondary | ICD-10-CM | POA: Diagnosis not present

## 2018-05-28 DIAGNOSIS — F411 Generalized anxiety disorder: Secondary | ICD-10-CM | POA: Diagnosis not present

## 2018-06-02 DIAGNOSIS — F411 Generalized anxiety disorder: Secondary | ICD-10-CM | POA: Diagnosis not present

## 2018-06-10 DIAGNOSIS — F411 Generalized anxiety disorder: Secondary | ICD-10-CM | POA: Diagnosis not present

## 2018-06-16 DIAGNOSIS — F411 Generalized anxiety disorder: Secondary | ICD-10-CM | POA: Diagnosis not present

## 2018-07-03 DIAGNOSIS — E039 Hypothyroidism, unspecified: Secondary | ICD-10-CM | POA: Diagnosis not present

## 2018-07-03 DIAGNOSIS — E538 Deficiency of other specified B group vitamins: Secondary | ICD-10-CM | POA: Diagnosis not present

## 2018-07-03 DIAGNOSIS — E559 Vitamin D deficiency, unspecified: Secondary | ICD-10-CM | POA: Diagnosis not present

## 2018-07-16 DIAGNOSIS — E538 Deficiency of other specified B group vitamins: Secondary | ICD-10-CM | POA: Diagnosis not present

## 2018-07-16 DIAGNOSIS — E559 Vitamin D deficiency, unspecified: Secondary | ICD-10-CM | POA: Diagnosis not present

## 2018-07-16 DIAGNOSIS — E039 Hypothyroidism, unspecified: Secondary | ICD-10-CM | POA: Diagnosis not present

## 2018-12-09 IMAGING — US US RENAL
1 series · 14 of 25 positions shown · non-contrast
Comparison: None.

CLINICAL DATA: Right-sided pain with microscopic hematuria

EXAM:
RENAL / URINARY TRACT ULTRASOUND COMPLETE

[Series 1: us renal · 0.20mm/px · 14 of 30 slices shown]
[im 1/30]
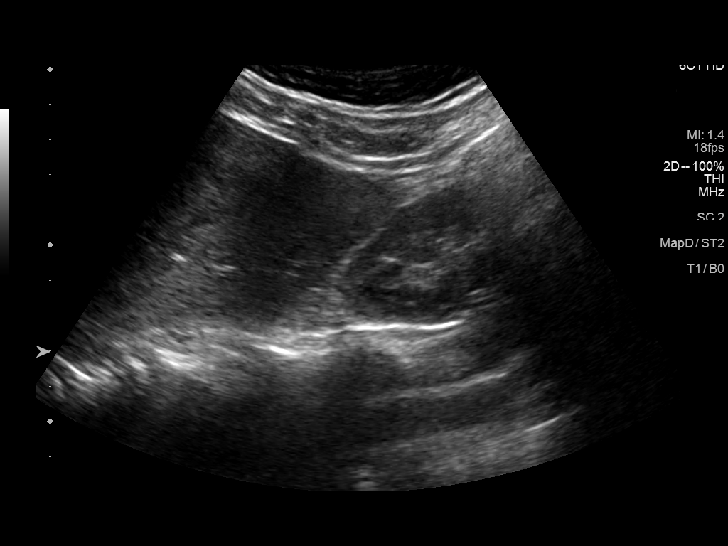
[im 3/30]
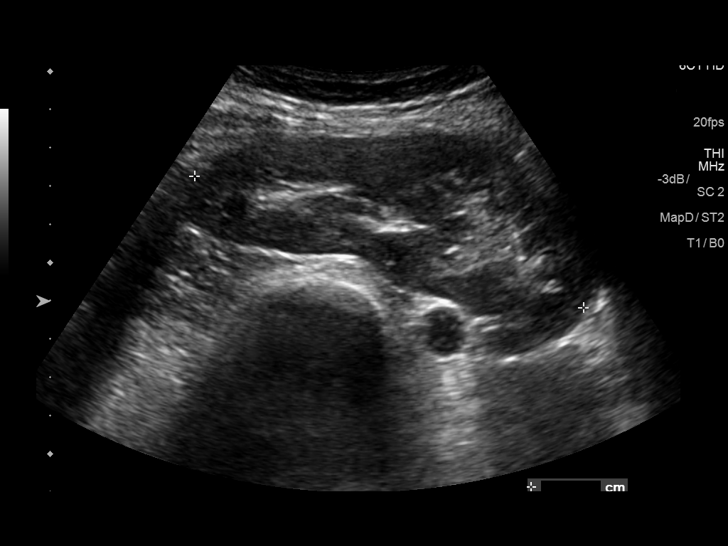
[im 5/30]
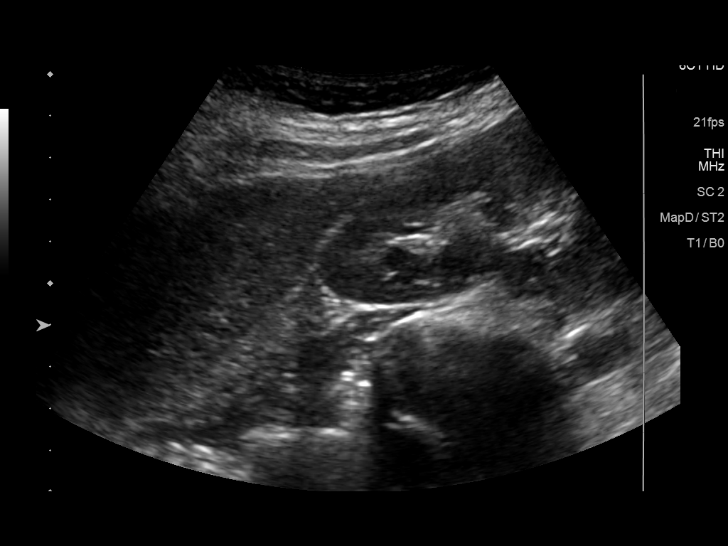
[im 8/30]
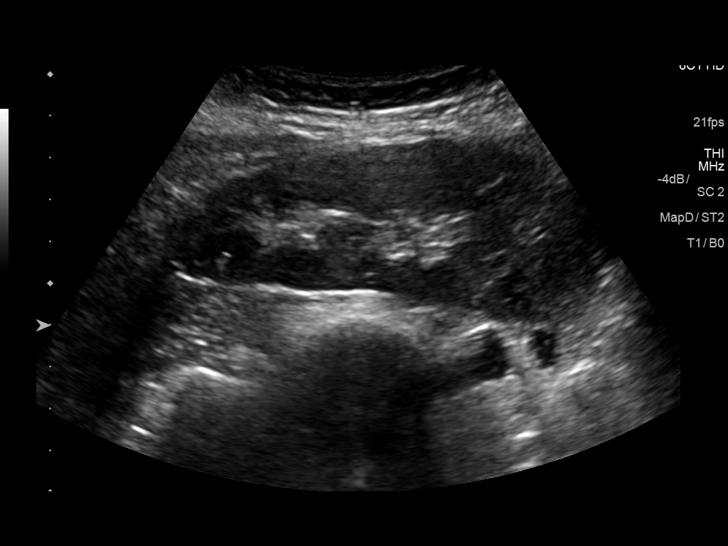
[im 10/30]
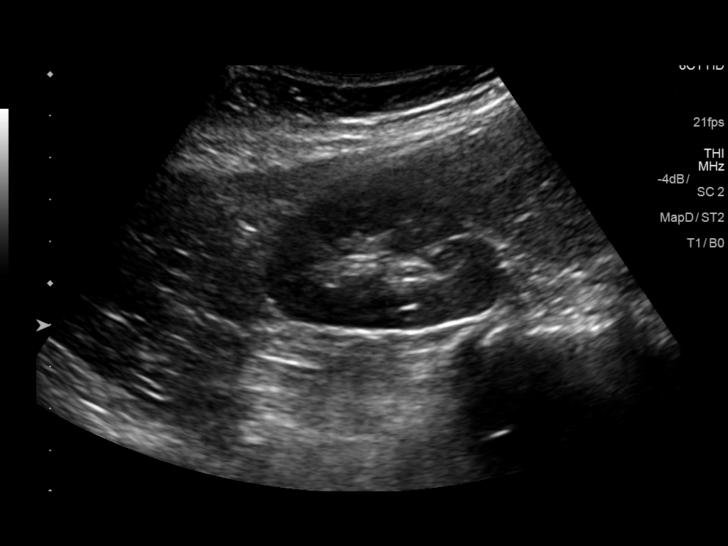
[im 11/30]
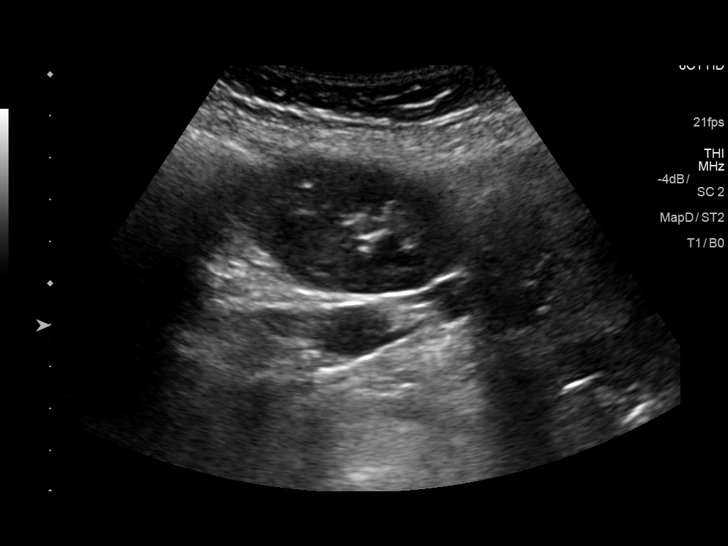
[im 14/30]
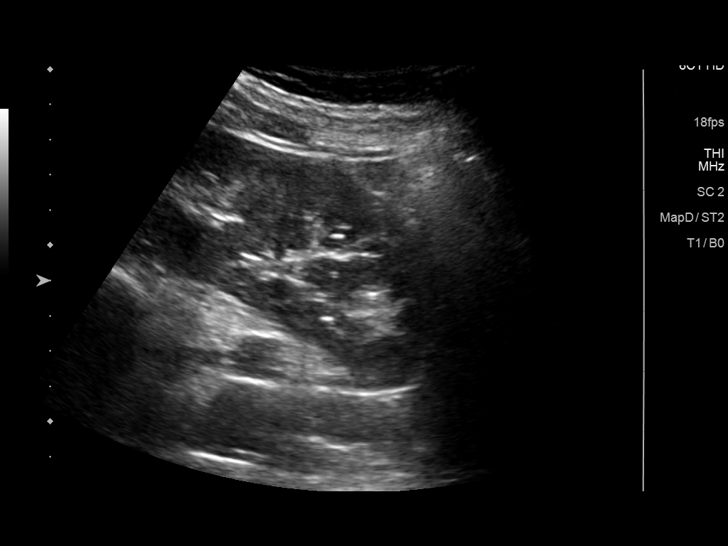
[im 16/30]
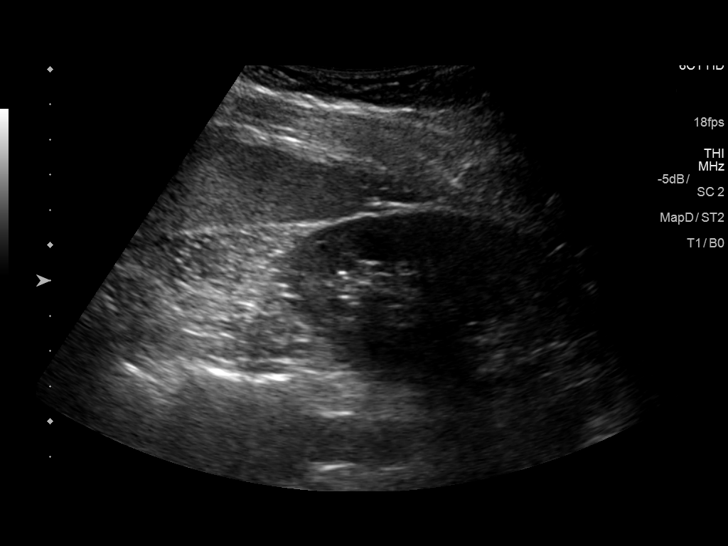
[im 19/30]
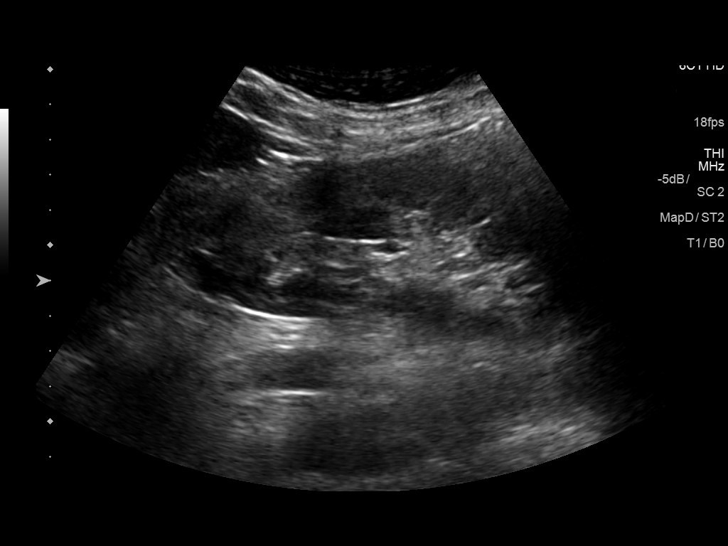
[im 20/30]
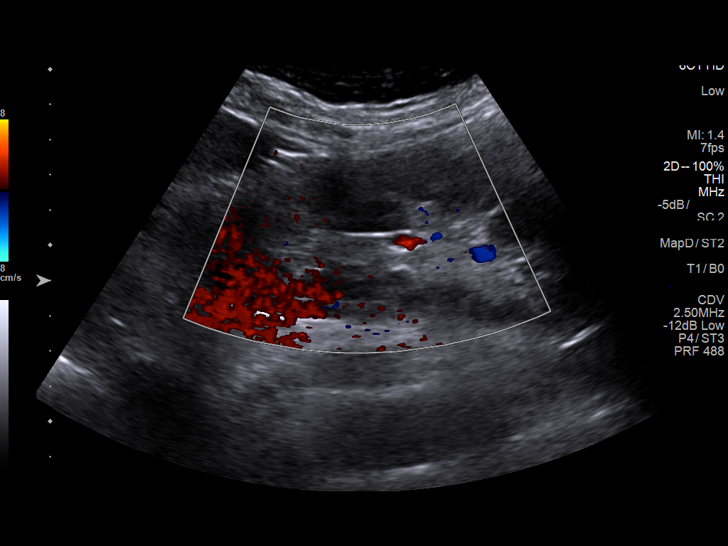
[im 22/30]
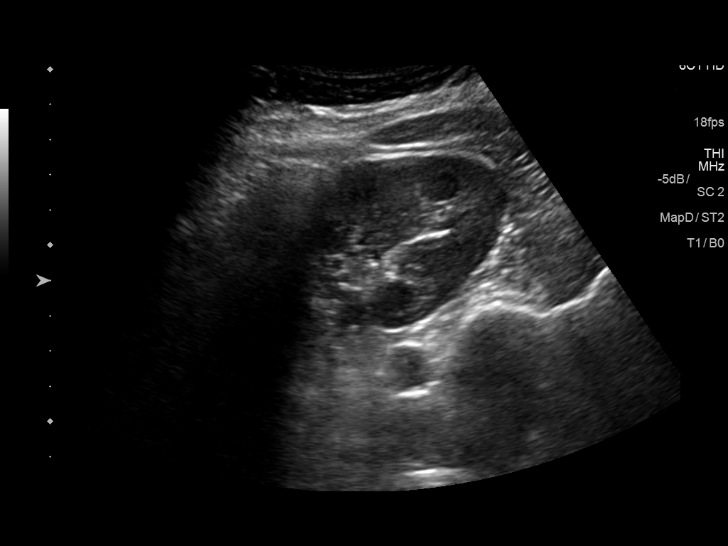
[im 25/30]
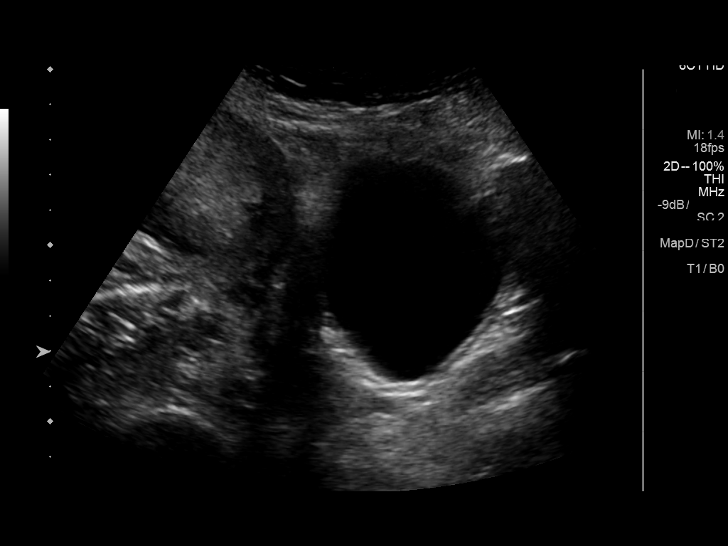
[im 27/30]
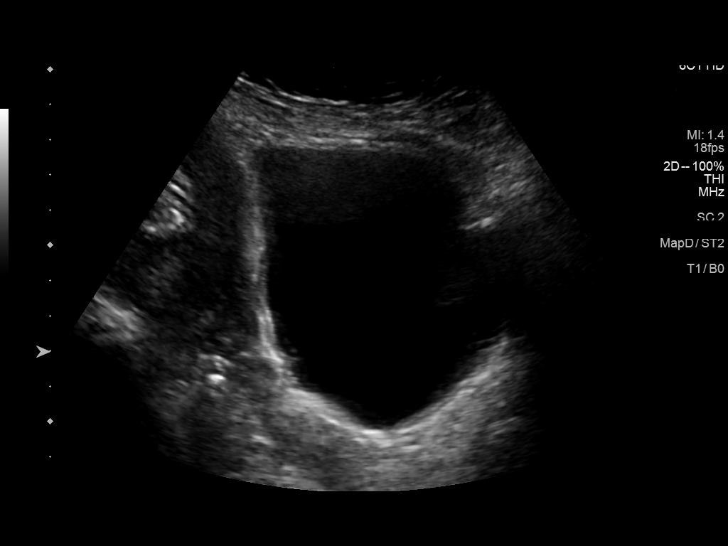
[im 30/30]
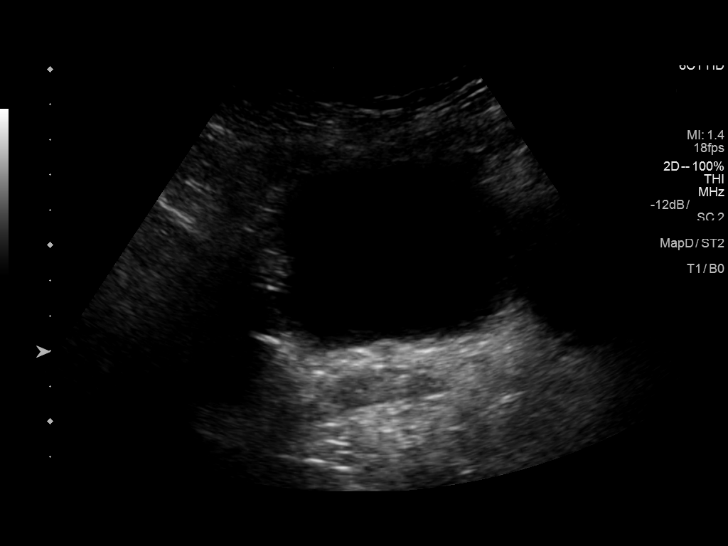

[14 of 25 positions shown; findings below may reference images not displayed]

FINDINGS: Right Kidney:

Length: 11.0 cm. Echogenicity and renal cortical thickness are
within normal limits. No mass, perinephric fluid, or hydronephrosis
visualized. No sonographically demonstrable calculus or
ureterectasis.

Left Kidney:

Length: 11.4 cm. Echogenicity and renal cortical thickness are
within normal limits. No mass, perinephric fluid, or hydronephrosis
visualized. No sonographically demonstrable calculus or
ureterectasis.

Bladder:

Appears normal for degree of bladder distention.
IMPRESSION: Study within normal limits.

## 2019-03-23 DIAGNOSIS — E538 Deficiency of other specified B group vitamins: Secondary | ICD-10-CM | POA: Diagnosis not present

## 2019-03-23 DIAGNOSIS — E039 Hypothyroidism, unspecified: Secondary | ICD-10-CM | POA: Diagnosis not present

## 2019-03-23 DIAGNOSIS — E063 Autoimmune thyroiditis: Secondary | ICD-10-CM | POA: Diagnosis not present

## 2019-03-23 DIAGNOSIS — E559 Vitamin D deficiency, unspecified: Secondary | ICD-10-CM | POA: Diagnosis not present

## 2019-04-07 ENCOUNTER — Encounter: Payer: Self-pay | Admitting: Neurology

## 2019-04-07 ENCOUNTER — Other Ambulatory Visit: Payer: Self-pay

## 2019-04-07 ENCOUNTER — Encounter

## 2019-04-07 ENCOUNTER — Ambulatory Visit: Payer: BC Managed Care – PPO | Admitting: Neurology

## 2019-04-07 VITALS — BP 119/73 | HR 99 | Temp 98.4°F | Ht 59.0 in | Wt 154.0 lb

## 2019-04-07 DIAGNOSIS — R442 Other hallucinations: Secondary | ICD-10-CM

## 2019-04-07 DIAGNOSIS — R51 Headache with orthostatic component, not elsewhere classified: Secondary | ICD-10-CM

## 2019-04-07 DIAGNOSIS — H539 Unspecified visual disturbance: Secondary | ICD-10-CM | POA: Diagnosis not present

## 2019-04-07 DIAGNOSIS — M7918 Myalgia, other site: Secondary | ICD-10-CM

## 2019-04-07 DIAGNOSIS — G441 Vascular headache, not elsewhere classified: Secondary | ICD-10-CM

## 2019-04-07 DIAGNOSIS — G43709 Chronic migraine without aura, not intractable, without status migrainosus: Secondary | ICD-10-CM | POA: Diagnosis not present

## 2019-04-07 MED ORDER — ONDANSETRON 4 MG PO TBDP: 4.0000 mg | ORAL_TABLET | Freq: Three times a day (TID) | ORAL | 3 refills | Status: AC | PRN

## 2019-04-07 MED ORDER — RIZATRIPTAN BENZOATE 10 MG PO TBDP
10.0000 mg | ORAL_TABLET | ORAL | 11 refills | Status: AC | PRN
Start: 2019-04-07 — End: ?

## 2019-04-07 NOTE — Patient Instructions (Signed)
MRI of the brain Start maxalt and zofran acute management  Rizatriptan disintegrating tablets What is this medicine? RIZATRIPTAN (rye za TRIP tan) is used to treat migraines with or without aura. An aura is a strange feeling or visual disturbance that warns you of an attack. It is not used to prevent migraines. This medicine may be used for other purposes; ask your health care provider or pharmacist if you have questions. COMMON BRAND NAME(S): Maxalt-MLT What should I tell my health care provider before I take this medicine? They need to know if you have any of these conditions:  cigarette smoker  circulation problems in fingers and toes  diabetes  heart disease  high blood pressure  high cholesterol  history of irregular heartbeat  history of stroke  kidney disease  liver disease  stomach or intestine problems  an unusual or allergic reaction to rizatriptan, other medicines, foods, dyes, or preservatives  pregnant or trying to get pregnant  breast-feeding How should I use this medicine? Take this medicine by mouth. Follow the directions on the prescription label. Leave the tablet in the sealed blister pack until you are ready to take it. With dry hands, open the blister and gently remove the tablet. If the tablet breaks or crumbles, throw it away and take a new tablet out of the blister pack. Place the tablet in the mouth and allow it to dissolve, and then swallow. Do not cut, crush, or chew this medicine. You do not need water to take this medicine. Do not take it more often than directed. Talk to your pediatrician regarding the use of this medicine in children. While this drug may be prescribed for children as young as 6 years for selected conditions, precautions do apply. Overdosage: If you think you have taken too much of this medicine contact a poison control center or emergency room at once. NOTE: This medicine is only for you. Do not share this medicine with  others. What if I miss a dose? This does not apply. This medicine is not for regular use. What may interact with this medicine? Do not take this medicine with any of the following medicines:  certain medicines for migraine headache like almotriptan, eletriptan, frovatriptan, naratriptan, rizatriptan, sumatriptan, zolmitriptan  ergot alkaloids like dihydroergotamine, ergonovine, ergotamine, methylergonovine  MAOIs like Carbex, Eldepryl, Marplan, Nardil, and Parnate This medicine may also interact with the following medications:  certain medicines for depression, anxiety, or psychotic disorders  propranolol This list may not describe all possible interactions. Give your health care provider a list of all the medicines, herbs, non-prescription drugs, or dietary supplements you use. Also tell them if you smoke, drink alcohol, or use illegal drugs. Some items may interact with your medicine. What should I watch for while using this medicine? Visit your healthcare professional for regular checks on your progress. Tell your healthcare professional if your symptoms do not start to get better or if they get worse. You may get drowsy or dizzy. Do not drive, use machinery, or do anything that needs mental alertness until you know how this medicine affects you. Do not stand up or sit up quickly, especially if you are an older patient. This reduces the risk of dizzy or fainting spells. Alcohol may interfere with the effect of this medicine. Your mouth may get dry. Chewing sugarless gum or sucking hard candy and drinking plenty of water may help. Contact your healthcare professional if the problem does not go away or is severe. If you take migraine  medicines for 10 or more days a month, your migraines may get worse. Keep a diary of headache days and medicine use. Contact your healthcare professional if your migraine attacks occur more frequently. What side effects may I notice from receiving this  medicine? Side effects that you should report to your doctor or health care professional as soon as possible:  allergic reactions like skin rash, itching or hives, swelling of the face, lips, or tongue  chest pain or chest tightness  signs and symptoms of a dangerous change in heartbeat or heart rhythm like chest pain; dizziness; fast, irregular heartbeat; palpitations; feeling faint or lightheaded; falls; breathing problems  signs and symptoms of a stroke like changes in vision; confusion; trouble speaking or understanding; severe headaches; sudden numbness or weakness of the face, arm or leg; trouble walking; dizziness; loss of balance or coordination  signs and symptoms of serotonin syndrome like irritable; confusion; diarrhea; fast or irregular heartbeat; muscle twitching; stiff muscles; trouble walking; sweating; high fever; seizures; chills; vomiting Side effects that usually do not require medical attention (report to your doctor or health care professional if they continue or are bothersome):  diarrhea  dizziness  drowsiness  dry mouth  headache  nausea, vomiting  pain, tingling, numbness in the hands or feet  stomach pain This list may not describe all possible side effects. Call your doctor for medical advice about side effects. You may report side effects to FDA at 1-800-FDA-1088. Where should I keep my medicine? Keep out of the reach of children. Store at room temperature between 15 and 30 degrees C (59 and 86 degrees F). Protect from light and moisture. Throw away any unused medicine after the expiration date. NOTE: This sheet is a summary. It may not cover all possible information. If you have questions about this medicine, talk to your doctor, pharmacist, or health care provider.  2020 Elsevier/Gold Standard (2018-02-25 14:58:08)

## 2019-04-07 NOTE — Progress Notes (Signed)
[facility]    Provider:  Dr Jaynee Eagles Requesting Provider: Jonathon Jordan, MD Dr. Debbora Presto Endocrin[facility]  Primary Care Provider:  Jonathon Jordan, MD  CC:  Migraines  HPI:  Deanna Hicks is a 33 y.o. female here as requested by Jonathon Jordan, MD for migraines. She has a history of migraines. The most recent was so bad that she was ready to go to the ED but feared for covid. She has a phantom smoke smell and dissy lightheaded spells more noticeable when walking up stairs, usually when walking, even more recently tingling on the top of her head icy hot feeling on the top. She has had migraines since nursing, sister also has migraines. TMJ can trigger them, starts on the right side her whole right side of her face and radiates down the neck and to the shoulders and to the left side. The migraines are random, she can wake up with a headache and the headache can be positionally , movement makes it worse, throbbing and pulsating, photo/phonophobia, some nausea, no vomiting. Daily headaches, migraines can be moderately severe and severe at this frequency for the last year worsening since second, not worse with menstruation every month, she is not on birth control, they use condoms and husband to have a vasectomy they are not having anymore children. Worse in the evening, pressure helps massage on the head, no medication overuse, can last up to 24 hours. Sleep helps. No other focal neurologic deficits, associated symptoms, inciting events or modifiable factors.  Medications tried: Zofran, zoloft, wellbutrin  Review of Systems: Patient complains of symptoms per HPI as well as the following symptoms: no gait problems. Pertinent negatives and positives per HPI. All others negative.   Social History   Socioeconomic History  . Marital status: Married    Spouse name: Not on file  . Number of children: 2  . Years of education: Not on file  . Highest education level: Bachelor's  degree (e.g., BA, AB, BS)  Occupational History  . Not on file  Social Needs  . Financial resource strain: Not on file  . Food insecurity    Worry: Not on file    Inability: Not on file  . Transportation needs    Medical: Not on file    Non-medical: Not on file  Tobacco Use  . Smoking status: Former Research scientist (life sciences)  . Smokeless tobacco: Never Used  . Tobacco comment: while in college  Substance and Sexual Activity  . Alcohol use: Yes    Comment: RARE  . Drug use: No  . Sexual activity: Yes    Partners: Male    Birth control/protection: Condom  Lifestyle  . Physical activity    Days per week: Not on file    Minutes per session: Not on file  . Stress: Not on file  Relationships  . Social Herbalist on phone: Not on file    Gets together: Not on file    Attends religious service: Not on file    Active member of club or organization: Not on file    Attends meetings of clubs or organizations: Not on file    Relationship status: Not on file  . Intimate partner violence    Fear of current or ex partner: Not on file    Emotionally abused: Not on file    Physically abused: Not on file    Forced sexual activity: Not on file  Other Topics Concern  . Not on file  Social  History Narrative   Lives at home with her husband and children   Right handed   Caffeine: 1 mug of coffee 1-2 times in the mornings    Family History  Problem Relation Age of Onset  . Diabetes Father   . Heart disease Father        passed away at age 35  . Endometriosis Mother   . Endometriosis Sister   . Prostate cancer Maternal Grandfather   . Dementia Maternal Grandfather   . Migraines Neg Hx     Past Medical History:  Diagnosis Date  . Endometriosis   . Gestational diabetes   . 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 Hypothyroidism   . Postpartum care following cesarean delivery (6/7) 02/01/2015    Patient Active Problem List   Diagnosis Date Noted  . Previous cesarean delivery  affecting pregnancy 12/21/2017  . Impaired glucose tolerance test 10/17/2017  . Postpartum care following cesarean delivery  02/01/2015  . Cesarean delivery delivered - indication: repeat 4/27 02/01/2015  . Hypothyroidism 10/09/2013    Past Surgical History:  Procedure Laterality Date  . CESAREAN SECTION N/A 02/01/2015   Procedure: Primary CESAREAN SECTION;  Surgeon: Olivia Mackieichard Taavon, MD;  Location: [facility];  Service: Obstetrics;  Laterality: N/A;  EDD: 02/02/15. approved by Myra  . CESAREAN SECTION N/A 12/21/2017   Procedure: Repeat CESAREAN SECTION;  Surgeon: Olivia Mackieaavon, Richard, MD;  [facility];  Service: Obstetrics;  Laterality: N/A;  EDD: 12/27/17    Current Outpatient Medications  Medication Sig Dispense Refill  . OVER THE COUNTER MEDICATION Fish oil with DHA    . Prenatal Multivit-Min-Fe-FA (PRENATAL VITAMINS PO) Take 1 tablet by mouth 3 (three) times daily.     Marland Kitchen. UNABLE TO FIND Med Name: nature thyroid    . VITAMIN D PO Take by mouth.    . ondansetron (ZOFRAN-ODT) 4 MG disintegrating tablet Take 1 tablet (4 mg total) by mouth every 8 (eight) hours as needed for nausea. 30 tablet 3  . rizatriptan (MAXALT-MLT) 10 MG disintegrating tablet Take 1 tablet (10 mg total) by mouth as needed for migraine. May repeat in 2 hours if needed 9 tablet 11   No current facility-administered medications for this visit.     Allergies as of 04/07/2019  . (No Known Allergies)    Vitals: BP 119/73 (BP Location: Left Arm, Patient Position: Sitting)   Pulse 99   Temp 98.4 F (36.9 C) Comment: taken by check-in staff  Ht 4\' 11"  (1.499 m)   Wt 154 lb (69.9 kg)   Breastfeeding No   BMI 31.10 kg/m  Last Weight:  Wt Readings from Last 1 Encounters:  04/07/19 154 lb (69.9 kg)   Last Height:   Ht Readings from Last 1 Encounters:  04/07/19 4\' 11"  (1.499 m)     Physical exam: Exam: Gen: NAD, conversant, well nourised, well groomed                     CV: RRR, no MRG. No Carotid  Bruits. No peripheral edema, warm, nontender Eyes: Conjunctivae clear without exudates or hemorrhage  Neuro: Detailed Neurologic Exam  Speech:    Speech is normal; fluent and spontaneous with normal comprehension.  Cognition:    The patient is oriented to person, place, and time;     recent and remote memory intact;     language fluent;     normal attention, concentration,     fund of knowledge Cranial Nerves:  The pupils are equal, round, and reactive to light. The fundi are normal and spontaneous venous pulsations are present. Visual fields are full to finger confrontation. Extraocular movements are intact. Trigeminal sensation is intact and the muscles of mastication are normal. The face is symmetric. The palate elevates in the midline. Hearing intact. Voice is normal. Shoulder shrug is normal. The tongue has normal motion without fasciculations.   Coordination:    Normal finger to nose and heel to shin. Normal rapid alternating movements.   Gait:    Heel-toe and tandem gait are normal.   Motor Observation:    No asymmetry, no atrophy, and no involuntary movements noted. Tone:    Normal muscle tone.    Posture:    Posture is normal. normal erect    Strength:    Strength is V/V in the upper and lower limbs.      Sensation: intact to LT     Reflex Exam:  DTR's:    Deep tendon reflexes in the upper and lower extremities are normal bilaterally.   Toes:    The toes are downgoing bilaterally.   Clonus:    Clonus is absent.    Assessment/Plan: This is a lovely 33 year old patient with migraines.  However given concerning symptoms including olfactory hallucinations we need an MRI of the brain to rule out other causes as below.  Olfactory hallucinations may be migraine aura however need MRI of the brain to rule out other causes and may consider EEG in the future.   MRI brain due to concerning symptoms of morning headaches, positional headaches,vision changes, olfactory  hallucinations  to look for space occupying mass, chiari or intracranial hypertension (pseudotumor) demyelination, seizure focus.  - Discussed oral meds, cgrp, botox and other migraine prevention, she will think about it  -Zofran and Maxalt for acute management.  - Dry needling for cervical myofascial pain.  And migraines.  -If worsening olfactory hallucinations consider EEG  Orders Placed This Encounter  Procedures  . MR BRAIN W WO CONTRAST  . CBC  . Comprehensive metabolic panel  . Ambulatory referral to Physical Therapy   Meds ordered this encounter  Medications  . rizatriptan (MAXALT-MLT) 10 MG disintegrating tablet    Sig: Take 1 tablet (10 mg total) by mouth as needed for migraine. May repeat in 2 hours if needed    Dispense:  9 tablet    Refill:  11  . ondansetron (ZOFRAN-ODT) 4 MG disintegrating tablet    Sig: Take 1 tablet (4 mg total) by mouth every 8 (eight) hours as needed for nausea.    Dispense:  30 tablet    Refill:  3    Cc: Mila PalmerWolters, Sharon, MD,  Dr. Lisabeth DevoidBallan  A total of 60 minutes was spent face-to-face with this patient. Over half this time was spent on counseling patient on the  1. Chronic migraine without aura without status migrainosus, not intractable   2. Other vascular headache   3. Vision changes   4. Olfactory hallucination   5. Cervical myofascial pain syndrome    diagnosis and different diagnostic and therapeutic options, counseling and coordination of care, risks ans benefits of management, compliance, or risk factor reduction and education.     Naomie DeanAntonia Alyssa Mancera, MD  [facility] [address] [place], [place] [id number]  Phone [phone] Fax [phone]

## 2019-04-08 LAB — COMPREHENSIVE METABOLIC PANEL
ALT: 6 IU/L (ref 0–32)
AST: 16 IU/L (ref 0–40)
Albumin/Globulin Ratio: 2.2 (ref 1.2–2.2)
Albumin: 5 g/dL — ABNORMAL HIGH (ref 3.8–4.8)
Alkaline Phosphatase: 57 IU/L (ref 39–117)
BUN/Creatinine Ratio: 9 (ref 9–23)
BUN: 7 mg/dL (ref 6–20)
Bilirubin Total: 0.3 mg/dL (ref 0.0–1.2)
CO2: 21 mmol/L (ref 20–29)
Calcium: 10 mg/dL (ref 8.7–10.2)
Chloride: 100 mmol/L (ref 96–106)
Creatinine, Ser: 0.74 mg/dL (ref 0.57–1.00)
GFR calc Af Amer: 121 mL/min/{1.73_m2} (ref >59)
GFR calc non Af Amer: 105 mL/min/{1.73_m2} (ref >59)
Globulin, Total: 2.3 g/dL (ref 1.5–4.5)
Glucose: 90 mg/dL (ref 65–99)
Potassium: 4.8 mmol/L (ref 3.5–5.2)
Sodium: 137 mmol/L (ref 134–144)
Total Protein: 7.3 g/dL (ref 6.0–8.5)

## 2019-04-08 LAB — CBC
Hematocrit: 41.6 % (ref 34.0–46.6)
Hemoglobin: 13.9 g/dL (ref 11.1–15.9)
MCH: 30.2 pg (ref 26.6–33.0)
MCHC: 33.4 g/dL (ref 31.5–35.7)
MCV: 90 fL (ref 79–97)
Platelets: 381 10*3/uL (ref 150–450)
RBC: 4.6 x10E6/uL (ref 3.77–5.28)
RDW: 12.1 % (ref 11.7–15.4)
WBC: 7.4 10*3/uL (ref 3.4–10.8)

## 2019-04-13 ENCOUNTER — Encounter: Payer: Self-pay | Admitting: Neurology

## 2019-04-14 ENCOUNTER — Telehealth: Payer: Self-pay | Admitting: Neurology

## 2019-04-14 NOTE — Telephone Encounter (Signed)
LVM for pt to call back about scheduling mri  [employer] Auth: [id number] (exp. 04/14/19 to 10/10/19)

## 2019-07-27 ENCOUNTER — Telehealth: Payer: Self-pay | Admitting: Neurology

## 2019-07-27 NOTE — Telephone Encounter (Signed)
Pt returned call and would like to schedule her MRI. Please advise. 

## 2019-07-27 NOTE — Telephone Encounter (Signed)
Pt would like to know if she can get a new referral for the MRI that she did not get a couple of months ago. Please advise.

## 2019-07-27 NOTE — Telephone Encounter (Signed)
I called the pt back and LVM (ok per DPR) advising the MRI was approved through 10/10/19 and it just needs to be scheduled. I left the office number in message and asked for a call back. Advised pt we do the MRIs here on tuesdays and thursdays and she can ask for the referrals dept to schedule it.

## 2019-07-29 NOTE — Telephone Encounter (Signed)
I called the patient to schedule but she did not answer so I left a VM asking her to call me back. DW  °

## 2019-09-16 DIAGNOSIS — Z0000 Encounter for general adult medical examination without abnormal findings: Secondary | ICD-10-CM | POA: Diagnosis not present

## 2019-09-16 DIAGNOSIS — E538 Deficiency of other specified B group vitamins: Secondary | ICD-10-CM | POA: Diagnosis not present

## 2019-09-16 DIAGNOSIS — E063 Autoimmune thyroiditis: Secondary | ICD-10-CM | POA: Diagnosis not present

## 2019-09-16 DIAGNOSIS — R5382 Chronic fatigue, unspecified: Secondary | ICD-10-CM | POA: Diagnosis not present

## 2019-10-12 ENCOUNTER — Telehealth: Payer: Self-pay | Admitting: Neurology

## 2019-10-12 NOTE — Telephone Encounter (Signed)
Pt has called and provided her insurance information [employer] Subscriber ID [id number] Group# [id number] Bin# [id number]  Pt has asked that it be known she is off today and that this is is really the best time to call her, while working she is only avail on her 30 min. Lunch break from 1:00-1:30.  Please call today if possible

## 2019-10-13 NOTE — Telephone Encounter (Signed)
The patient is scheduled at [facility] for 10/21/19. When you get the change I need a new order for a MRI Brain w/wo contrast.Thank you!

## 2019-10-13 NOTE — Telephone Encounter (Signed)
Noted, thank you  no to the covid questions MR Brain w/wo contrast Dr. Valaria Good Auth: [id number] (exp. 10/13/19 to 04/09/20)

## 2019-10-13 NOTE — Addendum Note (Signed)
Addended by: Naomie Dean B on: 10/13/2019 02:00 PM   Modules accepted: Orders

## 2019-10-13 NOTE — Telephone Encounter (Signed)
Ordered, thanks.

## 2019-10-21 ENCOUNTER — Other Ambulatory Visit: Payer: Self-pay

## 2019-10-21 ENCOUNTER — Ambulatory Visit: Payer: Self-pay

## 2019-10-21 DIAGNOSIS — R442 Other hallucinations: Secondary | ICD-10-CM

## 2019-10-21 DIAGNOSIS — H539 Unspecified visual disturbance: Secondary | ICD-10-CM | POA: Diagnosis not present

## 2019-10-21 DIAGNOSIS — G441 Vascular headache, not elsewhere classified: Secondary | ICD-10-CM

## 2019-10-21 DIAGNOSIS — R51 Headache with orthostatic component, not elsewhere classified: Secondary | ICD-10-CM

## 2019-10-21 MED ORDER — GADOBENATE DIMEGLUMINE 529 MG/ML IV SOLN
15.0000 mL | Freq: Once | INTRAVENOUS | Status: AC | PRN
  Administered 2019-10-21: 15 mL via INTRAVENOUS

## 2019-11-26 DIAGNOSIS — E538 Deficiency of other specified B group vitamins: Secondary | ICD-10-CM | POA: Diagnosis not present

## 2019-11-26 DIAGNOSIS — E559 Vitamin D deficiency, unspecified: Secondary | ICD-10-CM | POA: Diagnosis not present

## 2020-02-18 DIAGNOSIS — E039 Hypothyroidism, unspecified: Secondary | ICD-10-CM | POA: Diagnosis not present

## 2020-02-18 DIAGNOSIS — R195 Other fecal abnormalities: Secondary | ICD-10-CM | POA: Diagnosis not present

## 2020-02-18 DIAGNOSIS — Z79899 Other long term (current) drug therapy: Secondary | ICD-10-CM | POA: Diagnosis not present

## 2020-02-18 DIAGNOSIS — R21 Rash and other nonspecific skin eruption: Secondary | ICD-10-CM | POA: Diagnosis not present

## 2020-04-11 DIAGNOSIS — L438 Other lichen planus: Secondary | ICD-10-CM | POA: Diagnosis not present

## 2020-04-28 DIAGNOSIS — R140 Abdominal distension (gaseous): Secondary | ICD-10-CM | POA: Diagnosis not present

## 2020-04-28 DIAGNOSIS — R5383 Other fatigue: Secondary | ICD-10-CM | POA: Diagnosis not present

## 2020-04-28 DIAGNOSIS — L853 Xerosis cutis: Secondary | ICD-10-CM | POA: Diagnosis not present

## 2020-04-28 DIAGNOSIS — M2540 Effusion, unspecified joint: Secondary | ICD-10-CM | POA: Diagnosis not present

## 2020-04-28 DIAGNOSIS — E559 Vitamin D deficiency, unspecified: Secondary | ICD-10-CM | POA: Diagnosis not present

## 2020-04-28 DIAGNOSIS — L659 Nonscarring hair loss, unspecified: Secondary | ICD-10-CM | POA: Diagnosis not present

## 2020-04-28 DIAGNOSIS — E039 Hypothyroidism, unspecified: Secondary | ICD-10-CM | POA: Diagnosis not present

## 2020-04-28 DIAGNOSIS — E063 Autoimmune thyroiditis: Secondary | ICD-10-CM | POA: Diagnosis not present

## 2020-09-14 DIAGNOSIS — Z1159 Encounter for screening for other viral diseases: Secondary | ICD-10-CM | POA: Diagnosis not present

## 2020-10-27 DIAGNOSIS — E063 Autoimmune thyroiditis: Secondary | ICD-10-CM | POA: Diagnosis not present

## 2020-10-27 DIAGNOSIS — R1011 Right upper quadrant pain: Secondary | ICD-10-CM | POA: Diagnosis not present

## 2020-10-27 DIAGNOSIS — E039 Hypothyroidism, unspecified: Secondary | ICD-10-CM | POA: Diagnosis not present

## 2020-10-28 ENCOUNTER — Other Ambulatory Visit: Payer: Self-pay | Admitting: Family Medicine

## 2020-10-28 DIAGNOSIS — R1011 Right upper quadrant pain: Secondary | ICD-10-CM

## 2020-11-03 DIAGNOSIS — Z23 Encounter for immunization: Secondary | ICD-10-CM | POA: Diagnosis not present

## 2020-11-03 DIAGNOSIS — Z01411 Encounter for gynecological examination (general) (routine) with abnormal findings: Secondary | ICD-10-CM | POA: Diagnosis not present

## 2020-11-03 DIAGNOSIS — E039 Hypothyroidism, unspecified: Secondary | ICD-10-CM | POA: Diagnosis not present

## 2020-11-03 DIAGNOSIS — Z124 Encounter for screening for malignant neoplasm of cervix: Secondary | ICD-10-CM | POA: Diagnosis not present

## 2020-11-03 DIAGNOSIS — N926 Irregular menstruation, unspecified: Secondary | ICD-10-CM | POA: Diagnosis not present

## 2020-11-03 DIAGNOSIS — Z6823 Body mass index (BMI) 23.0-23.9, adult: Secondary | ICD-10-CM | POA: Diagnosis not present

## 2020-11-03 DIAGNOSIS — Z01419 Encounter for gynecological examination (general) (routine) without abnormal findings: Secondary | ICD-10-CM | POA: Diagnosis not present

## 2020-11-07 DIAGNOSIS — H43393 Other vitreous opacities, bilateral: Secondary | ICD-10-CM | POA: Diagnosis not present

## 2020-11-07 DIAGNOSIS — H40033 Anatomical narrow angle, bilateral: Secondary | ICD-10-CM | POA: Diagnosis not present

## 2020-11-08 ENCOUNTER — Ambulatory Visit
Admission: RE | Admit: 2020-11-08 | Discharge: 2020-11-08 | Disposition: A | Discharge status: Home / Self Care | Payer: BC Managed Care – PPO | Source: Ambulatory Visit | Attending: Family Medicine | Admitting: Family Medicine

## 2020-11-08 DIAGNOSIS — R1011 Right upper quadrant pain: Secondary | ICD-10-CM

## 2021-04-07 DIAGNOSIS — E559 Vitamin D deficiency, unspecified: Secondary | ICD-10-CM | POA: Diagnosis not present

## 2021-04-07 DIAGNOSIS — R202 Paresthesia of skin: Secondary | ICD-10-CM | POA: Diagnosis not present

## 2021-04-07 DIAGNOSIS — E039 Hypothyroidism, unspecified: Secondary | ICD-10-CM | POA: Diagnosis not present

## 2021-04-07 DIAGNOSIS — R5383 Other fatigue: Secondary | ICD-10-CM | POA: Diagnosis not present

## 2021-04-07 DIAGNOSIS — E063 Autoimmune thyroiditis: Secondary | ICD-10-CM | POA: Diagnosis not present

## 2021-08-10 IMAGING — US US ABDOMEN LIMITED RUQ/ASCITES
1 series · 14 of 25 positions shown · non-contrast
Comparison: None.

CLINICAL DATA: Right-sided abdominal pain.

EXAM:
ULTRASOUND ABDOMEN LIMITED RIGHT UPPER QUADRANT

[Series 1: us abdomen limited ruq/ascites · 0.14mm/px · 14 of 51 slices shown]
[im 1/51]
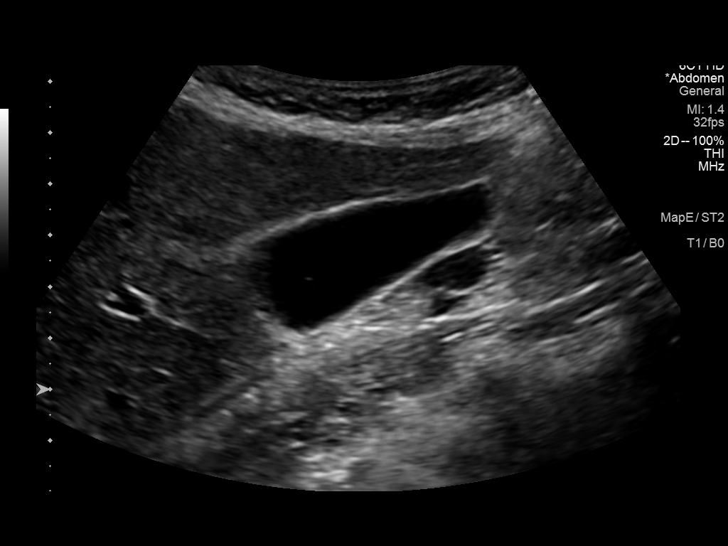
[im 5/51]
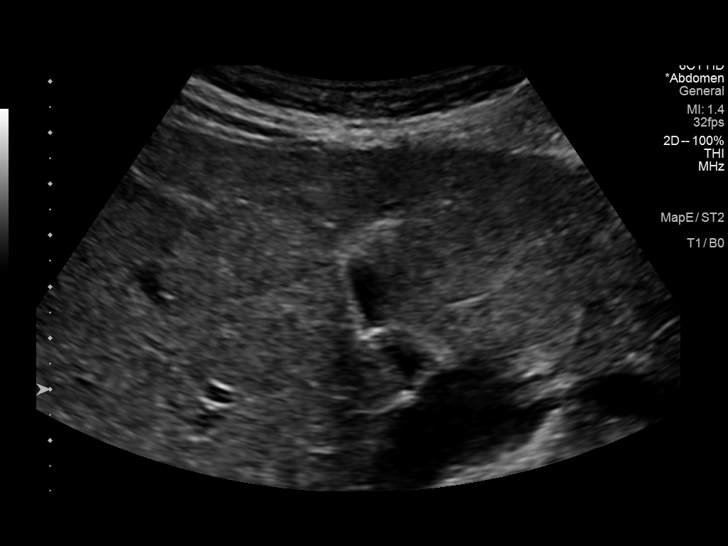
[im 9/51]
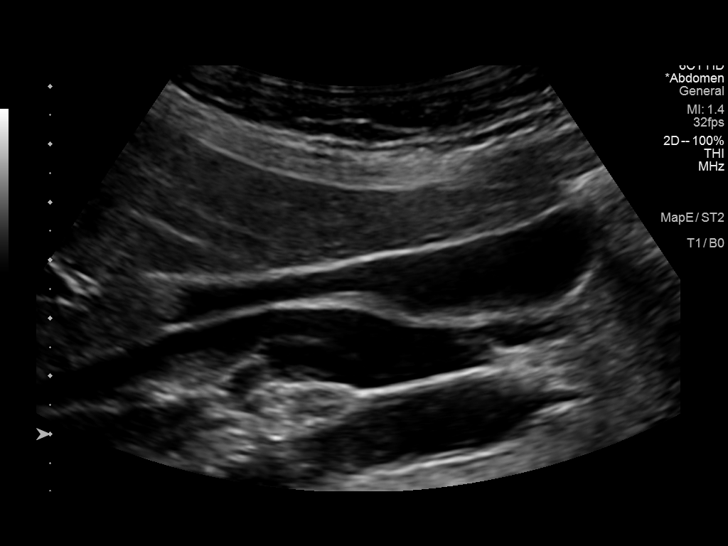
[im 13/51]
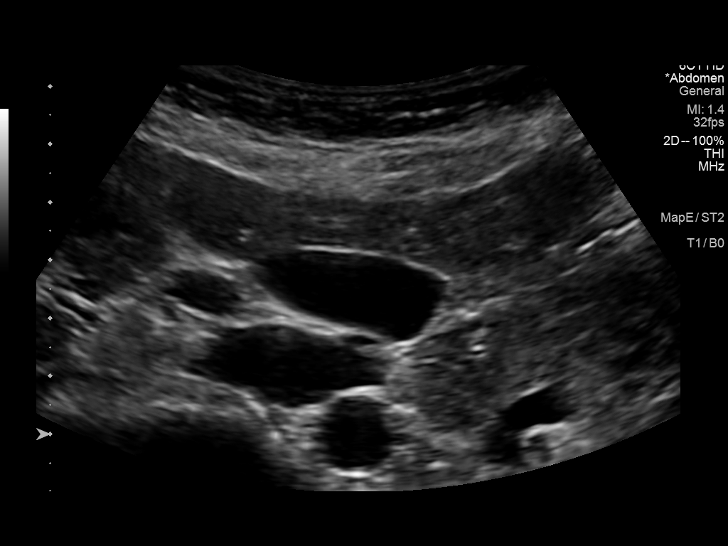
[im 17/51]
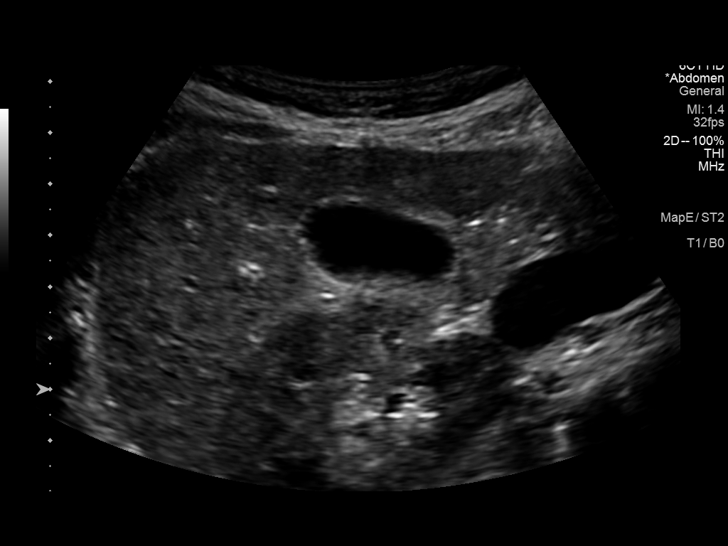
[im 19/51]
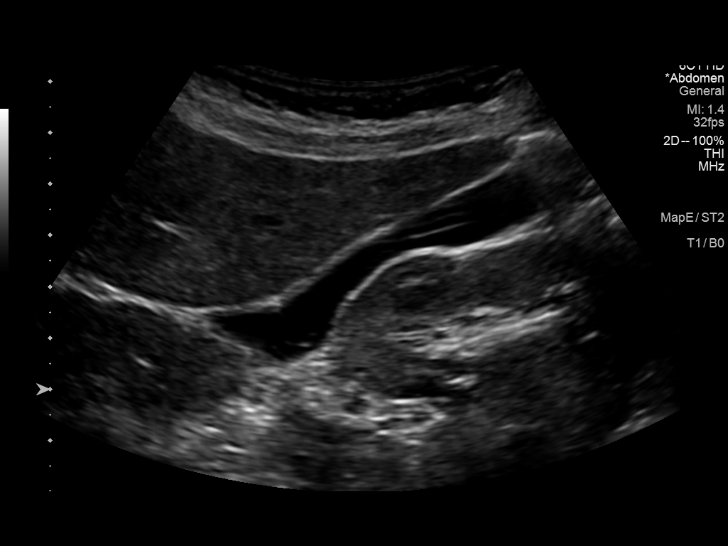
[im 23/51]
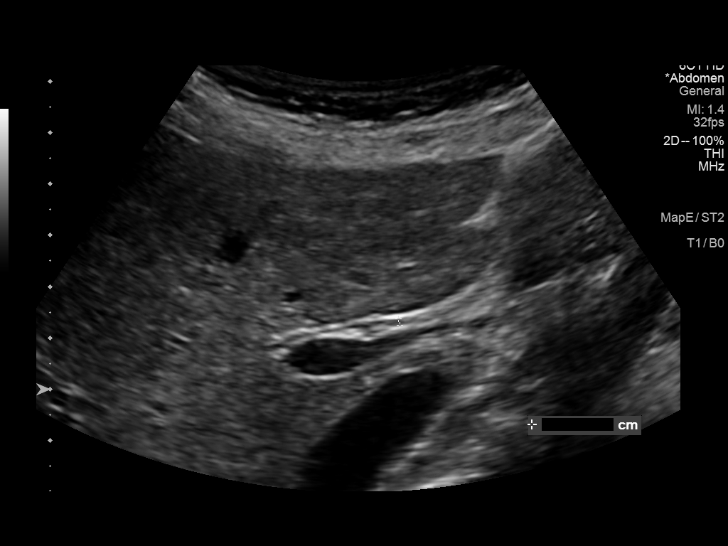
[im 28/51]
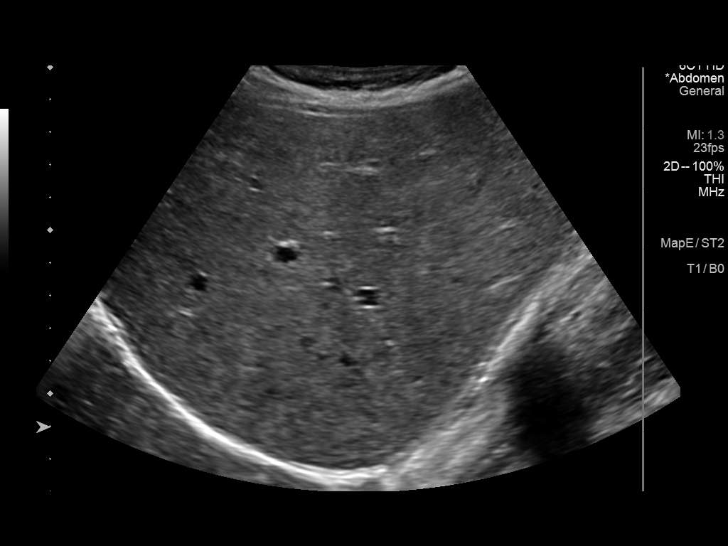
[im 32/51]
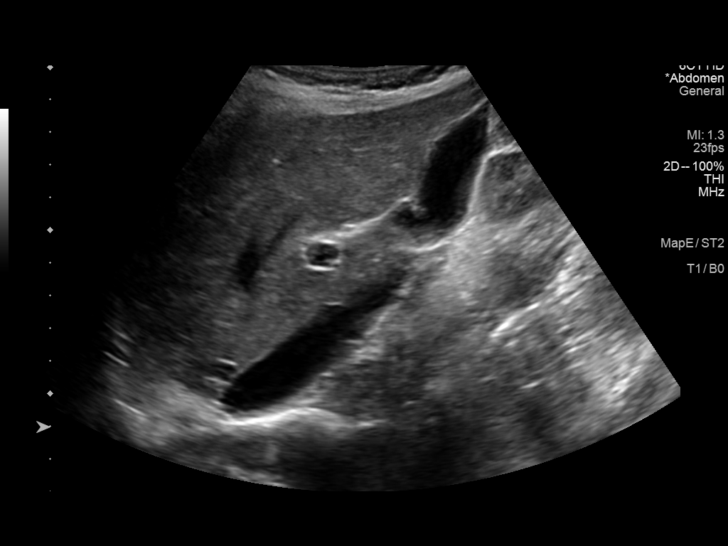
[im 34/51]
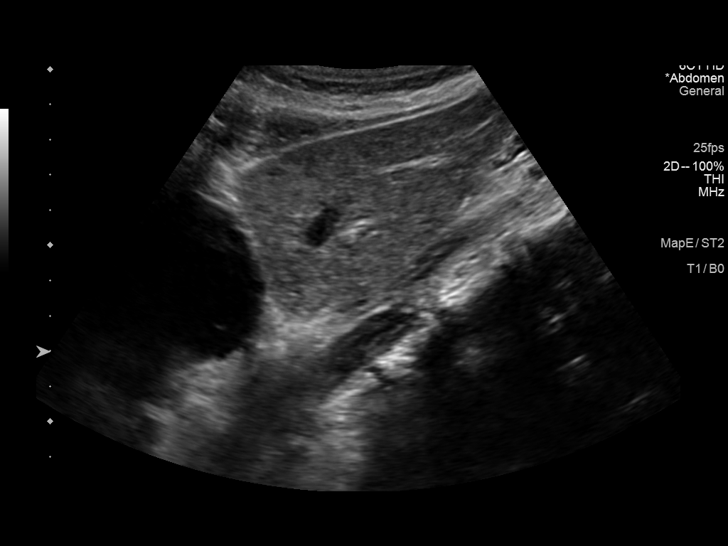
[im 38/51]
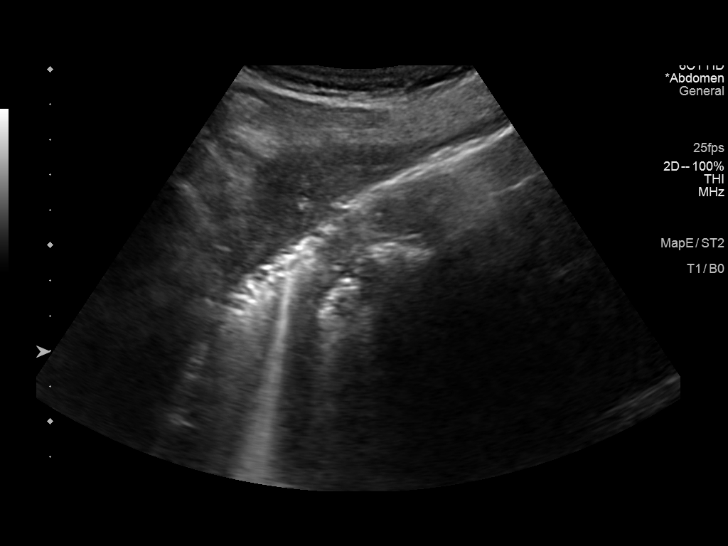
[im 42/51]
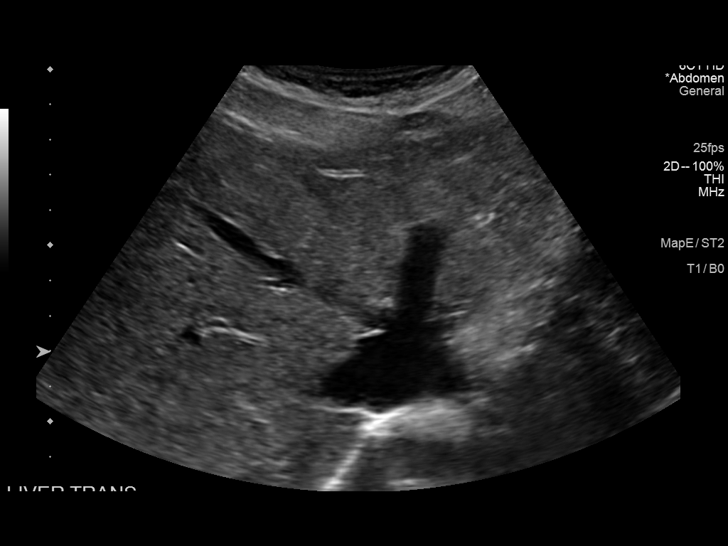
[im 46/51]
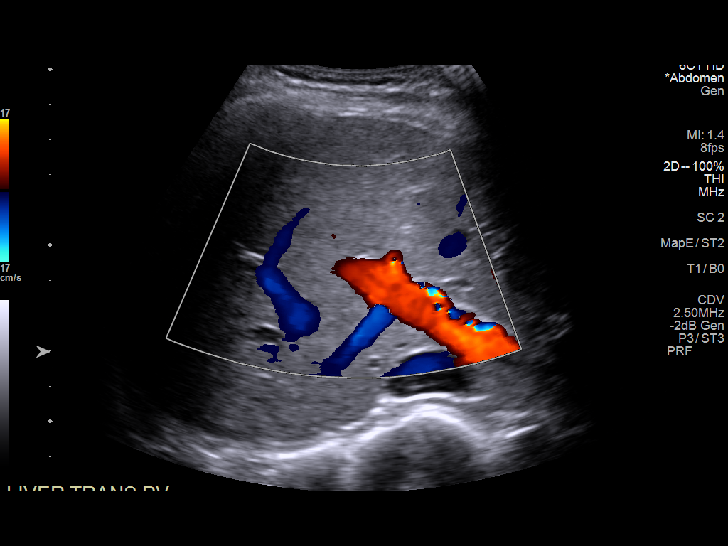
[im 51/51]
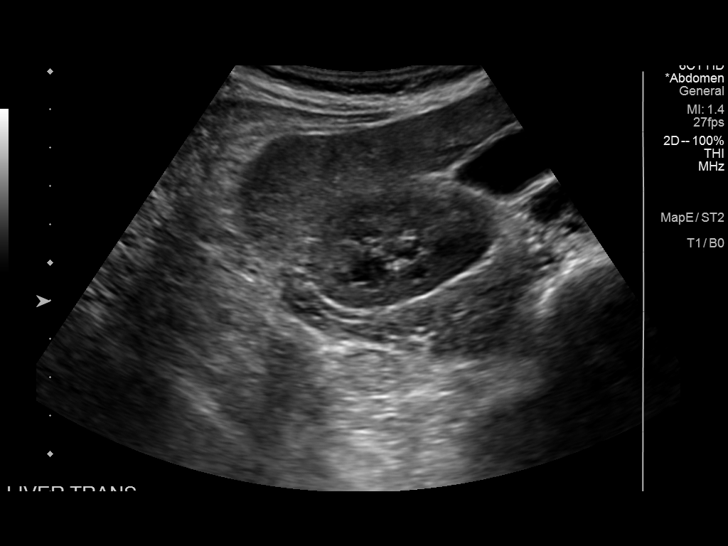

[14 of 25 positions shown; findings below may reference images not displayed]

FINDINGS: Gallbladder:

No gallstones or wall thickening visualized. No sonographic Murphy
sign noted by sonographer.

Common bile duct:

Diameter: 1 mm

Liver:

No focal lesion identified. Within normal limits in parenchymal
echogenicity. Portal vein is patent on color Doppler imaging with
normal direction of blood flow towards the liver.

Other: None.
IMPRESSION: Unremarkable right upper quadrant ultrasound.

## 2021-09-12 DIAGNOSIS — J0100 Acute maxillary sinusitis, unspecified: Secondary | ICD-10-CM | POA: Diagnosis not present

## 2021-09-14 DIAGNOSIS — J0100 Acute maxillary sinusitis, unspecified: Secondary | ICD-10-CM | POA: Diagnosis not present

## 2021-10-17 DIAGNOSIS — Z79899 Other long term (current) drug therapy: Secondary | ICD-10-CM | POA: Diagnosis not present

## 2021-10-17 DIAGNOSIS — R202 Paresthesia of skin: Secondary | ICD-10-CM | POA: Diagnosis not present

## 2021-10-17 DIAGNOSIS — E039 Hypothyroidism, unspecified: Secondary | ICD-10-CM | POA: Diagnosis not present

## 2022-04-07 DIAGNOSIS — Z0130 Encounter for examination of blood pressure without abnormal findings: Secondary | ICD-10-CM | POA: Diagnosis not present

## 2022-04-07 DIAGNOSIS — J3089 Other allergic rhinitis: Secondary | ICD-10-CM | POA: Diagnosis not present

## 2022-04-07 DIAGNOSIS — H04203 Unspecified epiphora, bilateral lacrimal glands: Secondary | ICD-10-CM | POA: Diagnosis not present

## 2022-04-07 DIAGNOSIS — Z6824 Body mass index (BMI) 24.0-24.9, adult: Secondary | ICD-10-CM | POA: Diagnosis not present

## 2022-04-07 DIAGNOSIS — H579 Unspecified disorder of eye and adnexa: Secondary | ICD-10-CM | POA: Diagnosis not present

## 2022-04-07 DIAGNOSIS — J309 Allergic rhinitis, unspecified: Secondary | ICD-10-CM | POA: Diagnosis not present
